# Patient Record
Sex: Female | Born: 1995 | Race: White | Hispanic: No | Marital: Single | Smoking: Never smoker
Health system: Southern US, Community
[De-identification: ages and names within clinical notes are randomized; demographics above are authoritative.]

## PROBLEM LIST (undated history)

## (undated) DIAGNOSIS — J45909 Unspecified asthma, uncomplicated: Secondary | ICD-10-CM

## (undated) DIAGNOSIS — F3181 Bipolar II disorder: Secondary | ICD-10-CM

## (undated) DIAGNOSIS — I10 Essential (primary) hypertension: Secondary | ICD-10-CM

## (undated) DIAGNOSIS — F419 Anxiety disorder, unspecified: Secondary | ICD-10-CM

## (undated) DIAGNOSIS — K219 Gastro-esophageal reflux disease without esophagitis: Secondary | ICD-10-CM

## (undated) DIAGNOSIS — F32A Depression, unspecified: Secondary | ICD-10-CM

## (undated) HISTORY — PX: NO PAST SURGERIES: SHX2092

## (undated) HISTORY — DX: Anxiety disorder, unspecified: F41.9

## (undated) HISTORY — DX: Depression, unspecified: F32.A

## (undated) HISTORY — DX: Bipolar II disorder: F31.81

## (undated) HISTORY — DX: Gastro-esophageal reflux disease without esophagitis: K21.9

## (undated) HISTORY — PX: TYMPANOSTOMY TUBE PLACEMENT: SHX32

---
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
## 2016-01-09 ENCOUNTER — Encounter (HOSPITAL_BASED_OUTPATIENT_CLINIC_OR_DEPARTMENT_OTHER): Payer: Self-pay | Admitting: Emergency Medicine

## 2016-01-09 ENCOUNTER — Emergency Department (HOSPITAL_BASED_OUTPATIENT_CLINIC_OR_DEPARTMENT_OTHER)
Admission: EM | Admit: 2016-01-09 | Discharge: 2016-01-09 | Disposition: A | Discharge status: Home / Self Care | Payer: 59 | Attending: Emergency Medicine | Admitting: Emergency Medicine

## 2016-01-09 DIAGNOSIS — Z791 Long term (current) use of non-steroidal anti-inflammatories (NSAID): Secondary | ICD-10-CM | POA: Insufficient documentation

## 2016-01-09 DIAGNOSIS — H9203 Otalgia, bilateral: Secondary | ICD-10-CM | POA: Diagnosis not present

## 2016-01-09 DIAGNOSIS — J45909 Unspecified asthma, uncomplicated: Secondary | ICD-10-CM | POA: Insufficient documentation

## 2016-01-09 DIAGNOSIS — Z7952 Long term (current) use of systemic steroids: Secondary | ICD-10-CM | POA: Insufficient documentation

## 2016-01-09 DIAGNOSIS — J029 Acute pharyngitis, unspecified: Secondary | ICD-10-CM | POA: Diagnosis present

## 2016-01-09 HISTORY — DX: Unspecified asthma, uncomplicated: J45.909

## 2016-01-09 MED ORDER — LIDOCAINE VISCOUS 2 % MT SOLN
15.0000 mL | Freq: Once | OROMUCOSAL | Status: AC
  Administered 2016-01-09: 15 mL via OROMUCOSAL
  Filled 2016-01-09: qty 15

## 2016-01-09 NOTE — ED Provider Notes (Signed)
CSN: [id number]     Arrival date & time 01/09/16  0820 History   First MD Initiated Contact with Patient 01/09/16 0845     Chief Complaint  Patient presents with  . Sore Throat     Patient is a 20 y.o. female presenting with pharyngitis. The history is provided by the patient. No language interpreter was used.  Sore Throat   Rose Gray is a 20 y.o. female who presents to the Emergency Department complaining of sore throat.  She reports 3 weeks of sore throat, congestion. She had fevers initially but these are now resolved. She saw her primary care provider 2 weeks ago and was given a course of steroids for a viral infection. 3 days ago she was seen again and had a strep test performed that was weakly positive and she was started on amoxicillin. Her steroids were also restarted that time. She reports sore throat that is bilateral sides for throat as well as ear pain. No cough. No vomiting. Symptoms are moderate, constant, worsening.  Past Medical History  Diagnosis Date  . Asthma    History reviewed. No pertinent past surgical history. No family history on file. Social History  Substance Use Topics  . Smoking status: Passive Smoke Exposure - Never Smoker  . Smokeless tobacco: None  . Alcohol Use: No   OB History    No data available     Review of Systems  All other systems reviewed and are negative.     Allergies  Review of patient's allergies indicates no known allergies.  Home Medications   Prior to Admission medications   Medication Sig Start Date End Date Taking? Authorizing Provider  ibuprofen (ADVIL,MOTRIN) 800 MG tablet Take 1 tablet (800 mg total) by mouth 3 (three) times daily. 12/22/13   Lonia Skinner Sofia, PA-C   BP 139/89 mmHg  Pulse 100  Temp(Src) 98.6 F (37 C) (Oral)  Resp 16  Ht  (1.702 m)  Wt 170 lb (77.111 kg)  BMI 26.62 kg/m2  SpO2 100% Physical Exam  Constitutional: She is oriented to person, place, and time. She appears well-developed  and well-nourished.  HENT:  Head: Normocephalic and atraumatic.  Right Ear: External ear normal.  Left Ear: External ear normal.  Large amount of tonsillar swelling and exudate bilaterally. No PTA.  Cardiovascular: Normal rate and regular rhythm.   No murmur heard. Pulmonary/Chest: Effort normal and breath sounds normal. No respiratory distress.  No stridor.  Abdominal: Soft. There is no tenderness. There is no rebound and no guarding.  Musculoskeletal: She exhibits no edema or tenderness.  Neurological: She is alert and oriented to person, place, and time.  Skin: Skin is warm and dry.  Psychiatric: She has a normal mood and affect. Her behavior is normal.  Nursing note and vitals reviewed.   ED Course  Procedures (including critical care time) Labs Review Labs Reviewed - No data to display  Imaging Review No results found. I have personally reviewed and evaluated these images and lab results as part of my medical decision-making.   EKG Interpretation None      MDM   Final diagnoses:  Viral pharyngitis    Patient here for evaluation of 3 weeks of sore throat, just started on amoxicillin. She is nontoxic appearing on examination. There is no evidence of peritonsillar abscess. Presentation is not consistent with epiglottitis, RPA. Discussed continuing her current treatment with antibiotics for pharyngitis, may be strep versus viral. Discussed close outpatient follow-up and return  precautions.    Tilden Fossa, MD 01/09/16 [phone]

## 2016-01-09 NOTE — Discharge Instructions (Signed)

## 2016-01-09 NOTE — ED Notes (Signed)
sorethroat  Head congestion  Intermittent fevers

## 2016-06-17 IMAGING — US US PELVIS COMPLETE
1 series · 14 of 25 positions shown · non-contrast
Comparison: None.

CLINICAL DATA: Abnormal uterine bleeding.

EXAM:
TRANSABDOMINAL ULTRASOUND OF PELVIS
TECHNIQUE: Transabdominal ultrasound examination of the pelvis was performed
including evaluation of the uterus, ovaries, adnexal regions, and
pelvic cul-de-sac.Patient not sexually active. Patient refused
transvaginal exam.

[Series 1: us pelvis complete · 0.20mm/px · 14 of 39 slices shown]
[im 1/39]
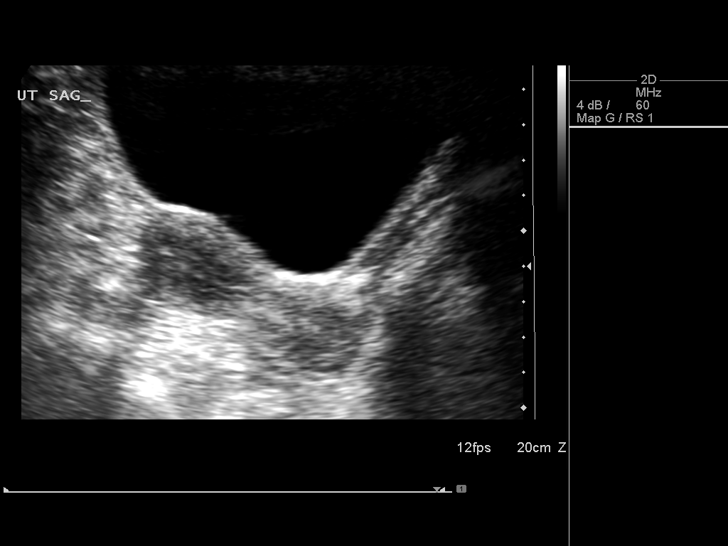
[im 4/39]
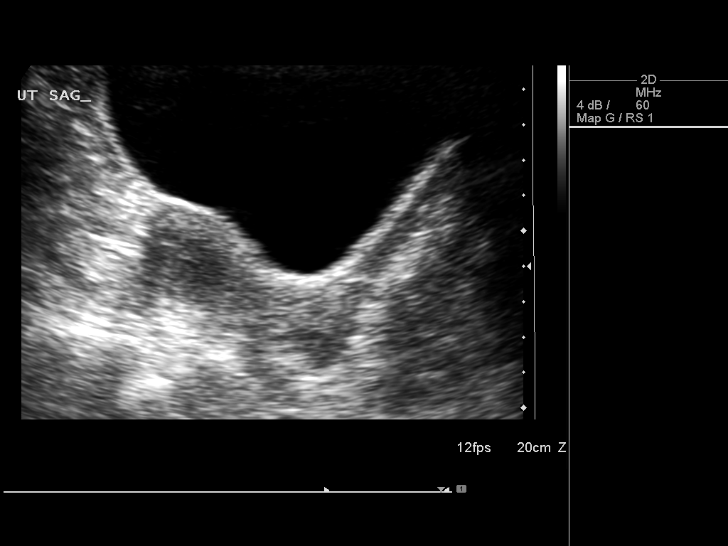
[im 7/39]
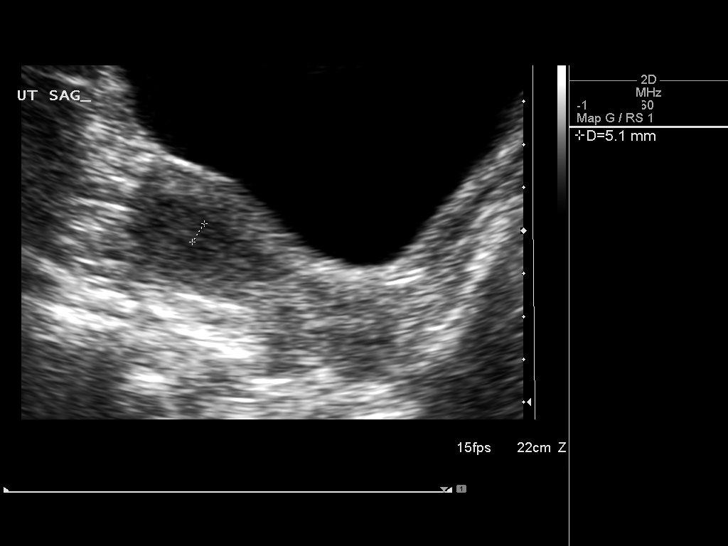
[im 10/39]
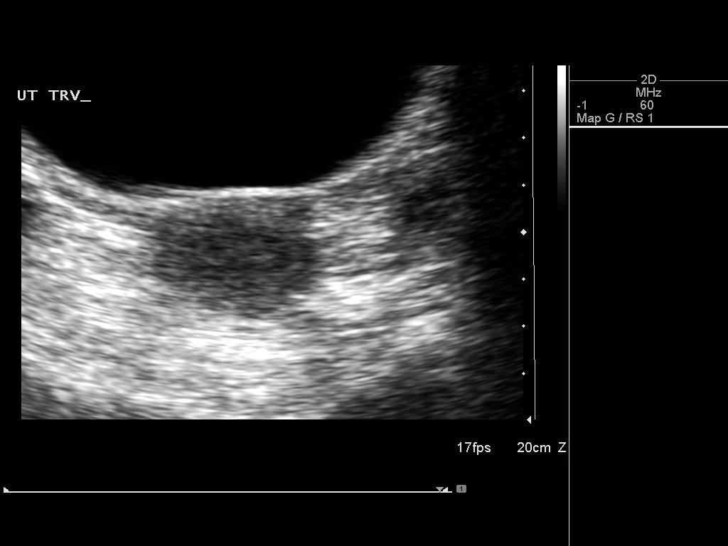
[im 13/39]
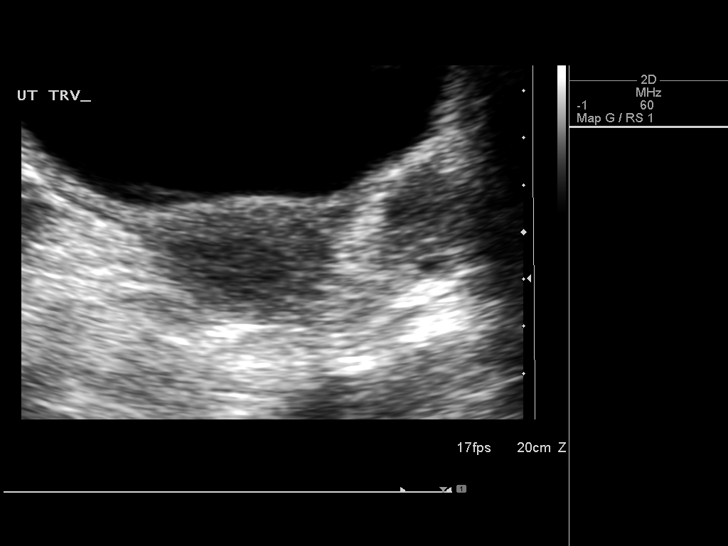
[im 15/39]
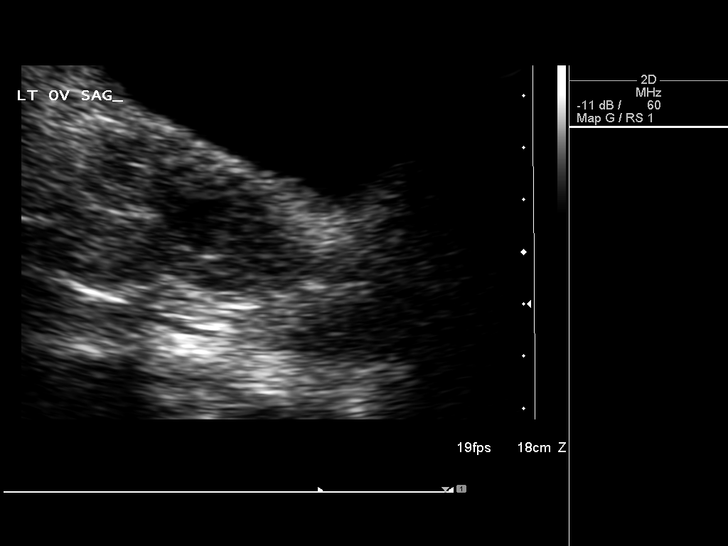
[im 18/39]
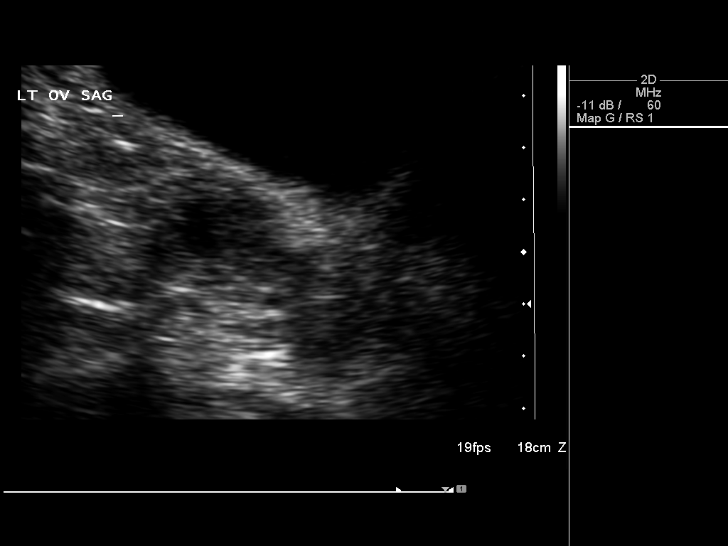
[im 21/39]
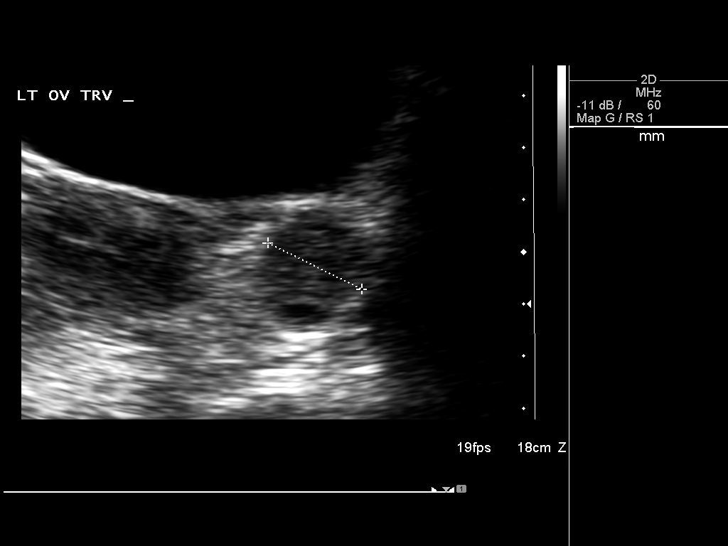
[im 24/39]
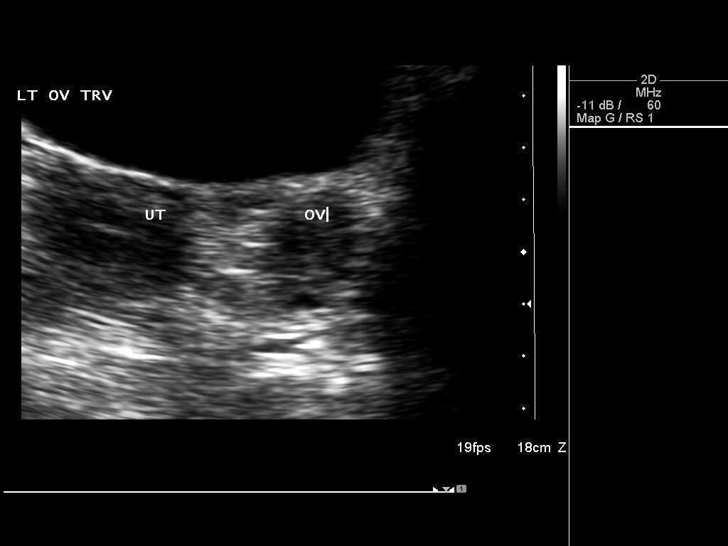
[im 26/39]
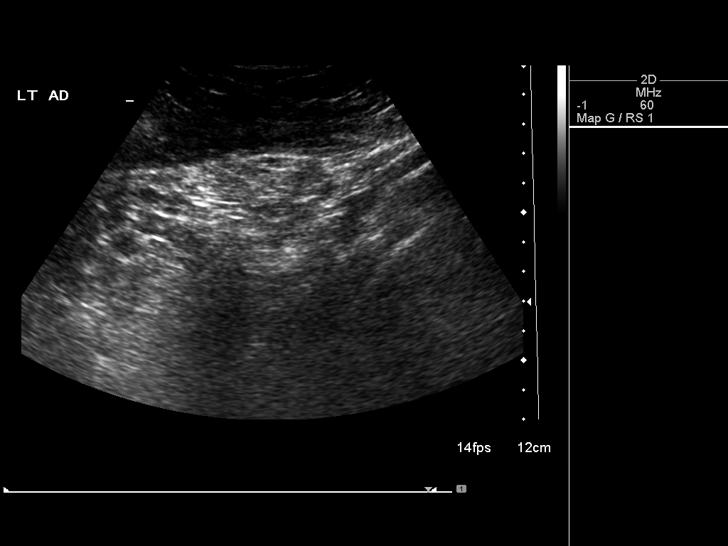
[im 29/39]
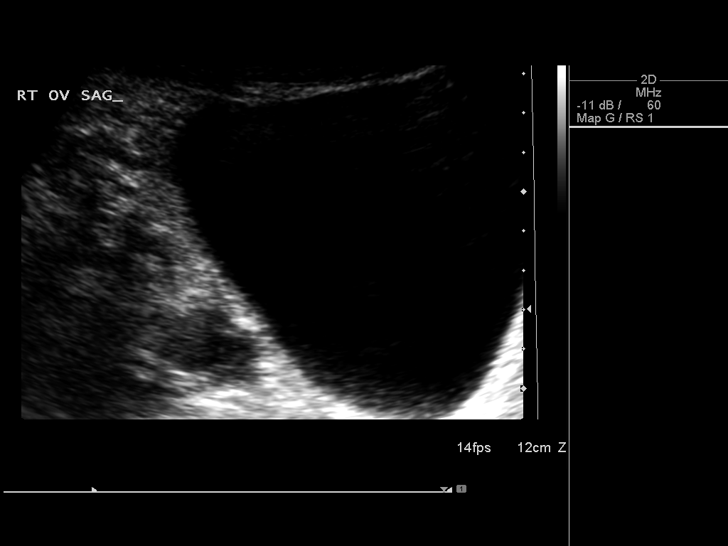
[im 32/39]
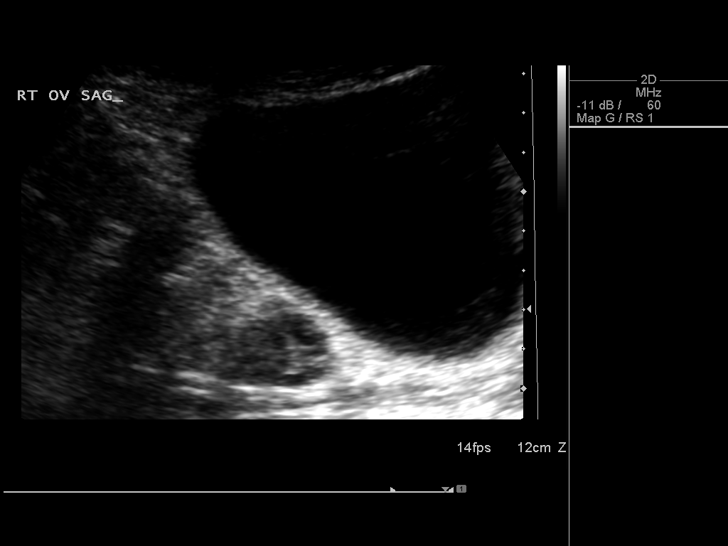
[im 35/39]
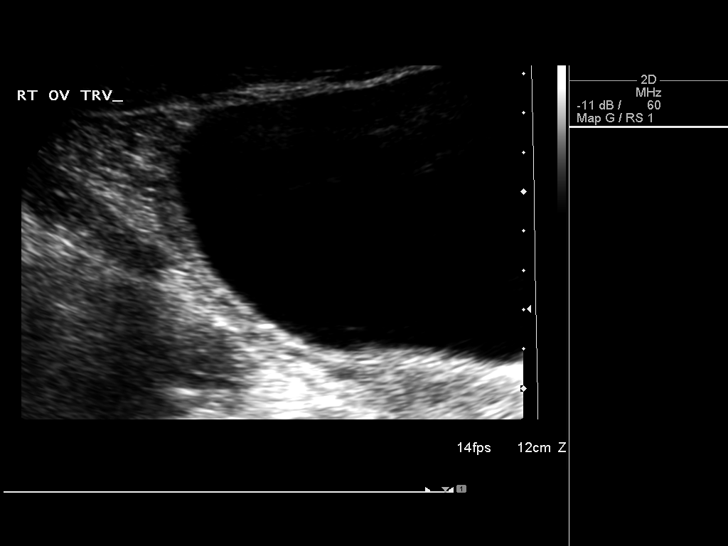
[im 39/39]
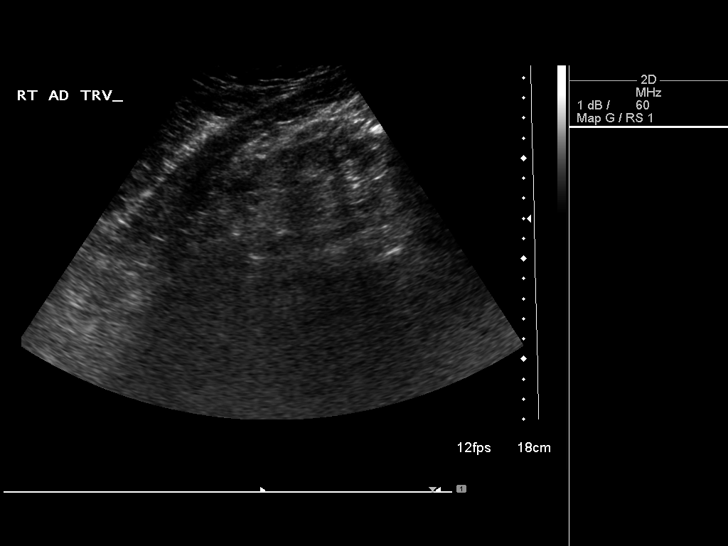

[14 of 25 positions shown; findings below may reference images not displayed]

FINDINGS: Uterus

Measurements: 7.1 x 2.5 x 3.6 cm. No fibroids or other mass
visualized.

Endometrium

Thickness: 5.1 mm.  No focal abnormality visualized.

Right ovary

Measurements: 3.1 x 1.9 x 2.0 cm. Normal appearance/no adnexal mass.

Left ovary

Measurements: 3.6 x 2.3 x 2.0 cm. Normal appearance/no adnexal mass.

Other findings:  No free fluid
IMPRESSION: Normal exam.

## 2018-01-21 ENCOUNTER — Ambulatory Visit
Admission: RE | Admit: 2018-01-21 | Discharge: 2018-01-21 | Disposition: A | Discharge status: Home / Self Care | Payer: Managed Care, Other (non HMO) | Source: Ambulatory Visit | Attending: Physician Assistant | Admitting: Physician Assistant

## 2018-01-21 ENCOUNTER — Other Ambulatory Visit: Payer: Self-pay | Admitting: Physician Assistant

## 2018-01-21 DIAGNOSIS — R05 Cough: Secondary | ICD-10-CM

## 2018-01-21 DIAGNOSIS — R059 Cough, unspecified: Secondary | ICD-10-CM

## 2018-04-18 ENCOUNTER — Other Ambulatory Visit: Payer: Self-pay

## 2018-04-18 ENCOUNTER — Encounter (HOSPITAL_COMMUNITY)
Admission: RE | Admit: 2018-04-18 | Discharge: 2018-04-18 | Disposition: A | Discharge status: Home / Self Care | Payer: Managed Care, Other (non HMO) | Source: Ambulatory Visit | Attending: Otolaryngology | Admitting: Otolaryngology

## 2018-04-18 ENCOUNTER — Encounter (HOSPITAL_COMMUNITY): Payer: Self-pay

## 2018-04-18 DIAGNOSIS — Z01812 Encounter for preprocedural laboratory examination: Secondary | ICD-10-CM | POA: Diagnosis present

## 2018-04-18 LAB — CBC
HCT: 41 % (ref 36.0–46.0)
Hemoglobin: 13.6 g/dL (ref 12.0–15.0)
MCH: 31.1 pg (ref 26.0–34.0)
MCHC: 33.2 g/dL (ref 30.0–36.0)
MCV: 93.8 fL (ref 78.0–100.0)
Platelets: 448 10*3/uL — ABNORMAL HIGH (ref 150–400)
RBC: 4.37 MIL/uL (ref 3.87–5.11)
RDW: 12.4 % (ref 11.5–15.5)
WBC: 9.3 10*3/uL (ref 4.0–10.5)

## 2018-04-18 NOTE — Pre-Procedure Instructions (Signed)
Rose Gray  04/18/2018      [id number] - [place], [place] - [address] AT [facility] [address] [place] [address] Phone: [phone] Fax: [phone]    Your procedure is scheduled on Apr 25, 2018.  Report to [facility] Admitting at 615 AM.  Call this number if you have problems the morning of surgery:[phone]   Remember:  No food after midnight.  You may drink clear liquids until 515 AM .  Clear liquids allowed are:     Water, Juice (non-citric and without pulp), Carbonated beverages, Clear Tea, Black Coffee only, Plain Jell-O only, Gatorade and Plain Popsicles only   Continue all medications as directed by your physician except follow these medication instructions before surgery below    Take these medicines the morning of surgery with A SIP OF WATER  Norgestimate-ethinyl estradiol triaphasic birth control  7 days prior to surgery STOP taking any Aspirin (unless otherwise instructed by your surgeon), Aleve, Naproxen, Ibuprofen, Motrin, Advil, Goody's, BC's, all herbal medications, fish oil, and all vitamins   Do not wear jewelry, make-up or nail polish.  Do not wear lotions, powders, or perfumes, or deodorant.  Do not shave 48 hours prior to surgery.    Do not bring valuables to the hospital.  [facility] is not responsible for any belongings or valuables.  Contacts, dentures or bridgework may not be worn into surgery.  Leave your suitcase in the car.  After surgery it may be brought to your room.  For patients admitted to the hospital, discharge time will be determined by your treatment team.  Patients discharged the day of surgery will not be allowed to drive home.    [facility]- Preparing For Surgery  Before surgery, you can play an important role. Because skin is not sterile, your skin needs to be as free of germs as possible. You can reduce the number of germs on your skin by washing with CHG  (chlorahexidine gluconate) Soap before surgery.  CHG is an antiseptic cleaner which kills germs and bonds with the skin to continue killing germs even after washing.  Oral Hygiene is also important to reduce your risk of infection.  Remember - BRUSH YOUR TEETH THE MORNING OF SURGERY  Please do not use if you have an allergy to CHG or antibacterial soaps. If your skin becomes reddened/irritated stop using the CHG.  Do not shave (including legs and underarms) for at least 48 hours prior to first CHG shower. It is OK to shave your face.  Please follow these instructions carefully.   1. Shower the NIGHT BEFORE SURGERY and the MORNING OF SURGERY with CHG.   2. If you chose to wash your hair, wash your hair first as usual with your normal shampoo.  3. After you shampoo, rinse your hair and body thoroughly to remove the shampoo.  4. Use CHG as you would any other liquid soap. You can apply CHG directly to the skin and wash gently with a scrungie or a clean washcloth.   5. Apply the CHG Soap to your body ONLY FROM THE NECK DOWN.  Do not use on open wounds or open sores. Avoid contact with your eyes, ears, mouth and genitals (private parts). Wash Face and genitals (private parts)  with your normal soap.  6. Wash thoroughly, paying special attention to the area where your surgery will be performed.  7. Thoroughly rinse  your body with warm water from the neck down.  8. DO NOT shower/wash with your normal soap after using and rinsing off the CHG Soap.  9. Pat yourself dry with a CLEAN TOWEL.  10. Wear CLEAN PAJAMAS to bed the night before surgery, wear comfortable clothes the morning of surgery  11. Place CLEAN SHEETS on your bed the night of your first shower and DO NOT SLEEP WITH PETS.  Day of Surgery:  Do not apply any deodorants/lotions.  Please wear clean clothes to the hospital/surgery center.   Remember to brush your teeth.    Please read over the following fact sheets that you were  given. Pain Booklet and Coughing and Deep Breathing

## 2018-04-18 NOTE — Progress Notes (Addendum)
PCP: [facility]  Cardiologist: pt denies  EKG: pt denies  Stress test: pt denies  ECHO: pt denies  Cardiac Cath: pt denies  Chest x-ray: pt denies, no recent respiratory infections

## 2018-04-24 NOTE — Anesthesia Preprocedure Evaluation (Addendum)
Anesthesia Evaluation  Patient identified by MRN, date of birth, ID band Patient awake    Reviewed: Allergy & Precautions, NPO status , Patient's Chart, lab work & pertinent test results  Airway Mallampati: I  TM Distance: >3 FB Neck ROM: Full    Dental no notable dental hx. (+) Teeth Intact, Dental Advisory Given   Pulmonary neg pulmonary ROS,    Pulmonary exam normal breath sounds clear to auscultation       Cardiovascular negative cardio ROS Normal cardiovascular exam Rhythm:Regular Rate:Normal     Neuro/Psych negative neurological ROS  negative psych ROS   GI/Hepatic negative GI ROS, Neg liver ROS,   Endo/Other  negative endocrine ROS  Renal/GU negative Renal ROS  negative genitourinary   Musculoskeletal negative musculoskeletal ROS (+)   Abdominal   Peds negative pediatric ROS (+)  Hematology negative hematology ROS (+)   Anesthesia Other Findings   Reproductive/Obstetrics negative OB ROS                            Anesthesia Physical Anesthesia Plan  ASA: II  Anesthesia Plan: General   Post-op Pain Management:    Induction: Intravenous  PONV Risk Score and Plan: 3 and Treatment may vary due to age or medical condition, Scopolamine patch - Pre-op, Dexamethasone and Ondansetron  Airway Management Planned: Oral ETT  Additional Equipment:   Intra-op Plan:   Post-operative Plan: Extubation in OR  Informed Consent: I have reviewed the patients History and Physical, chart, labs and discussed the procedure including the risks, benefits and alternatives for the proposed anesthesia with the patient or authorized representative who has indicated his/her understanding and acceptance.   Dental advisory given  Plan Discussed with: CRNA  Anesthesia Plan Comments:       Anesthesia Quick Evaluation

## 2018-04-25 ENCOUNTER — Other Ambulatory Visit: Payer: Self-pay

## 2018-04-25 ENCOUNTER — Ambulatory Visit (HOSPITAL_COMMUNITY): Payer: Managed Care, Other (non HMO) | Admitting: Anesthesiology

## 2018-04-25 ENCOUNTER — Ambulatory Visit (HOSPITAL_COMMUNITY)
Admission: RE | Admit: 2018-04-25 | Discharge: 2018-04-25 | Disposition: A | Discharge status: Home / Self Care | Payer: Managed Care, Other (non HMO) | Source: Ambulatory Visit | Attending: Otolaryngology | Admitting: Otolaryngology

## 2018-04-25 ENCOUNTER — Encounter (HOSPITAL_COMMUNITY): Payer: Self-pay | Admitting: *Deleted

## 2018-04-25 ENCOUNTER — Encounter (HOSPITAL_COMMUNITY)
Admission: RE | Disposition: A | Discharge status: Home / Self Care | Payer: Self-pay | Source: Ambulatory Visit | Attending: Otolaryngology

## 2018-04-25 DIAGNOSIS — Z793 Long term (current) use of hormonal contraceptives: Secondary | ICD-10-CM | POA: Diagnosis not present

## 2018-04-25 DIAGNOSIS — J45909 Unspecified asthma, uncomplicated: Secondary | ICD-10-CM | POA: Insufficient documentation

## 2018-04-25 DIAGNOSIS — J3501 Chronic tonsillitis: Secondary | ICD-10-CM | POA: Diagnosis present

## 2018-04-25 HISTORY — PX: TONSILLECTOMY: SHX5217

## 2018-04-25 LAB — POCT PREGNANCY, URINE: Preg Test, Ur: NEGATIVE

## 2018-04-25 SURGERY — TONSILLECTOMY
Anesthesia: General | Site: Throat

## 2018-04-25 MED ORDER — PROMETHAZINE HCL 25 MG PO TABS: 25.0000 mg | ORAL_TABLET | Freq: Four times a day (QID) | ORAL | Status: DC | PRN

## 2018-04-25 MED ORDER — MIDAZOLAM HCL 2 MG/2ML IJ SOLN
INTRAMUSCULAR | Status: AC
  Filled 2018-04-25: qty 2

## 2018-04-25 MED ORDER — HYDROCODONE-ACETAMINOPHEN 7.5-325 MG/15ML PO SOLN
ORAL | Status: AC
  Filled 2018-04-25: qty 30

## 2018-04-25 MED ORDER — ACETAMINOPHEN 10 MG/ML IV SOLN
1000.0000 mg | Freq: Once | INTRAVENOUS | Status: DC | PRN
  Administered 2018-04-25: 1000 mg via INTRAVENOUS

## 2018-04-25 MED ORDER — KCL IN DEXTROSE-NACL 20-5-0.45 MEQ/L-%-% IV SOLN
INTRAVENOUS | Status: DC
  Administered 2018-04-25: 11:00:00 via INTRAVENOUS
  Filled 2018-04-25: qty 1000

## 2018-04-25 MED ORDER — PROPOFOL 10 MG/ML IV BOLUS
INTRAVENOUS | Status: AC
  Filled 2018-04-25: qty 40

## 2018-04-25 MED ORDER — DEXAMETHASONE SODIUM PHOSPHATE 10 MG/ML IJ SOLN
INTRAMUSCULAR | Status: DC | PRN
  Administered 2018-04-25: 10 mg via INTRAVENOUS

## 2018-04-25 MED ORDER — ACETAMINOPHEN 325 MG PO TABS
650.0000 mg | ORAL_TABLET | Freq: Once | ORAL | Status: AC
  Administered 2018-04-25: 650 mg via ORAL
  Filled 2018-04-25: qty 2

## 2018-04-25 MED ORDER — ROCURONIUM BROMIDE 100 MG/10ML IV SOLN
INTRAVENOUS | Status: DC | PRN
  Administered 2018-04-25: 50 mg via INTRAVENOUS

## 2018-04-25 MED ORDER — ONDANSETRON HCL 4 MG/2ML IJ SOLN
INTRAMUSCULAR | Status: DC | PRN
  Administered 2018-04-25: 4 mg via INTRAVENOUS

## 2018-04-25 MED ORDER — FENTANYL CITRATE (PF) 100 MCG/2ML IJ SOLN
INTRAMUSCULAR | Status: DC | PRN
  Administered 2018-04-25: 100 ug via INTRAVENOUS

## 2018-04-25 MED ORDER — FENTANYL CITRATE (PF) 250 MCG/5ML IJ SOLN
INTRAMUSCULAR | Status: AC
  Filled 2018-04-25: qty 5

## 2018-04-25 MED ORDER — HYDROCODONE-ACETAMINOPHEN 7.5-325 MG/15ML PO SOLN
10.0000 mL | ORAL | Status: DC | PRN
  Administered 2018-04-25 (×2): 15 mL via ORAL
  Filled 2018-04-25: qty 15

## 2018-04-25 MED ORDER — SUGAMMADEX SODIUM 200 MG/2ML IV SOLN
INTRAVENOUS | Status: DC | PRN
  Administered 2018-04-25: 200 mg via INTRAVENOUS

## 2018-04-25 MED ORDER — HYDROMORPHONE HCL 2 MG/ML IJ SOLN: 0.3000 mg | INTRAMUSCULAR | Status: DC | PRN

## 2018-04-25 MED ORDER — ACETAMINOPHEN 10 MG/ML IV SOLN
INTRAVENOUS | Status: AC
  Filled 2018-04-25: qty 100

## 2018-04-25 MED ORDER — SCOPOLAMINE 1 MG/3DAYS TD PT72
1.0000 | MEDICATED_PATCH | TRANSDERMAL | Status: DC
  Administered 2018-04-25: 1.5 mg via TRANSDERMAL
  Filled 2018-04-25: qty 1

## 2018-04-25 MED ORDER — IBUPROFEN 100 MG/5ML PO SUSP
400.0000 mg | Freq: Four times a day (QID) | ORAL | Status: DC | PRN
  Administered 2018-04-25: 400 mg via ORAL
  Filled 2018-04-25 (×2): qty 20

## 2018-04-25 MED ORDER — HYDROCODONE-ACETAMINOPHEN 7.5-325 MG PO TABS: 1.0000 | ORAL_TABLET | Freq: Once | ORAL | Status: DC | PRN

## 2018-04-25 MED ORDER — 0.9 % SODIUM CHLORIDE (POUR BTL) OPTIME
TOPICAL | Status: DC | PRN
  Administered 2018-04-25: 1000 mL

## 2018-04-25 MED ORDER — PROMETHAZINE HCL 25 MG RE SUPP
25.0000 mg | Freq: Four times a day (QID) | RECTAL | Status: DC | PRN
  Filled 2018-04-25: qty 1

## 2018-04-25 MED ORDER — PROMETHAZINE HCL 25 MG/ML IJ SOLN: 6.2500 mg | INTRAMUSCULAR | Status: DC | PRN

## 2018-04-25 MED ORDER — LIDOCAINE HCL (CARDIAC) PF 100 MG/5ML IV SOSY
PREFILLED_SYRINGE | INTRAVENOUS | Status: DC | PRN
  Administered 2018-04-25: 60 mg via INTRAVENOUS

## 2018-04-25 MED ORDER — LACTATED RINGERS IV SOLN
INTRAVENOUS | Status: DC
  Administered 2018-04-25: 07:00:00 via INTRAVENOUS

## 2018-04-25 MED ORDER — PROPOFOL 10 MG/ML IV BOLUS
INTRAVENOUS | Status: DC | PRN
  Administered 2018-04-25: 200 mg via INTRAVENOUS

## 2018-04-25 MED ORDER — NORGESTIM-ETH ESTRAD TRIPHASIC 0.18/0.215/0.25 MG-25 MCG PO TABS: 1.0000 | ORAL_TABLET | Freq: Every day | ORAL | Status: DC

## 2018-04-25 MED ORDER — HYDROCODONE-ACETAMINOPHEN 7.5-325 MG/15ML PO SOLN: 15.0000 mL | ORAL | 0 refills | Status: DC | PRN

## 2018-04-25 MED ORDER — MIDAZOLAM HCL 5 MG/5ML IJ SOLN
INTRAMUSCULAR | Status: DC | PRN
  Administered 2018-04-25: 2 mg via INTRAVENOUS

## 2018-04-25 MED ORDER — MEPERIDINE HCL 50 MG/ML IJ SOLN: 6.2500 mg | INTRAMUSCULAR | Status: DC | PRN

## 2018-04-25 SURGICAL SUPPLY — 32 items
BLADE SURG 15 STRL LF DISP TIS (BLADE) IMPLANT
BLADE SURG 15 STRL SS (BLADE)
CANISTER SUCT 3000ML PPV (MISCELLANEOUS) ×2 IMPLANT
CATH ROBINSON RED A/P 10FR (CATHETERS) IMPLANT
CLEANER TIP ELECTROSURG 2X2 (MISCELLANEOUS) ×2 IMPLANT
COAGULATOR SUCT SWTCH 10FR 6 (ELECTROSURGICAL) ×2 IMPLANT
CRADLE DONUT ADULT HEAD (MISCELLANEOUS) IMPLANT
DRAPE HALF SHEET 40X57 (DRAPES) ×1 IMPLANT
ELECT COATED BLADE 2.86 ST (ELECTRODE) ×2 IMPLANT
ELECT REM PT RETURN 9FT ADLT (ELECTROSURGICAL)
ELECT REM PT RETURN 9FT PED (ELECTROSURGICAL)
ELECTRODE REM PT RETRN 9FT PED (ELECTROSURGICAL) IMPLANT
ELECTRODE REM PT RTRN 9FT ADLT (ELECTROSURGICAL) IMPLANT
GAUZE SPONGE 4X4 16PLY XRAY LF (GAUZE/BANDAGES/DRESSINGS) ×2 IMPLANT
GLOVE BIO SURGEON STRL SZ7.5 (GLOVE) ×2 IMPLANT
GOWN STRL REUS W/ TWL LRG LVL3 (GOWN DISPOSABLE) ×2 IMPLANT
GOWN STRL REUS W/TWL LRG LVL3 (GOWN DISPOSABLE) ×4
KIT BASIN OR (CUSTOM PROCEDURE TRAY) ×2 IMPLANT
KIT TURNOVER KIT B (KITS) ×2 IMPLANT
NDL HYPO 25GX1X1/2 BEV (NEEDLE) IMPLANT
NEEDLE HYPO 25GX1X1/2 BEV (NEEDLE) IMPLANT
NS IRRIG 1000ML POUR BTL (IV SOLUTION) ×2 IMPLANT
PACK SURGICAL SETUP 50X90 (CUSTOM PROCEDURE TRAY) ×2 IMPLANT
PAD ARMBOARD 7.5X6 YLW CONV (MISCELLANEOUS) ×4 IMPLANT
PENCIL BUTTON HOLSTER BLD 10FT (ELECTRODE) ×2 IMPLANT
SPECIMEN JAR SMALL (MISCELLANEOUS) ×4 IMPLANT
SPONGE TONSIL 1.25 RF SGL STRG (GAUZE/BANDAGES/DRESSINGS) ×1 IMPLANT
SYR BULB 3OZ (MISCELLANEOUS) ×2 IMPLANT
TUBE CONNECTING 12X1/4 (SUCTIONS) ×2 IMPLANT
TUBE SALEM SUMP 14F W/ARV (TUBING) ×2 IMPLANT
TUBE SALEM SUMP 16 FR W/ARV (TUBING) IMPLANT
YANKAUER SUCT BULB TIP NO VENT (SUCTIONS) ×2 IMPLANT

## 2018-04-25 NOTE — Transfer of Care (Signed)
Immediate Anesthesia Transfer of Care Note  Patient: Rose Gray  Procedure(s) Performed: TONSILLECTOMY (N/A Throat)  Patient Location: PACU  Anesthesia Type:General  Level of Consciousness: awake, alert , oriented and sedated  Airway & Oxygen Therapy: Patient Spontanous Breathing  Post-op Assessment: Report given to RN, Post -op Vital signs reviewed and stable and Patient moving all extremities  Post vital signs: Reviewed and stable  Last Vitals:  Vitals Value Taken Time  BP 126/90 04/25/2018  8:34 AM  Temp    Pulse 92 04/25/2018  8:41 AM  Resp 16 04/25/2018  8:41 AM  SpO2 98 % 04/25/2018  8:41 AM  Vitals shown include unvalidated device data.  Last Pain:  Vitals:   04/25/18 0625  TempSrc: Oral         Complications: No apparent anesthesia complications

## 2018-04-25 NOTE — H&P (Signed)
Rose Gray is an 22 y.o. female.   Chief Complaint: Chronic tonsillitis HPI: 22 year old female with recurring tonsil infections who presents for surgical management.  Past Medical History:  Diagnosis Date  . Asthma    childhood    Past Surgical History:  Procedure Laterality Date  . NO PAST SURGERIES      History reviewed. No pertinent family history. Social History:  reports that she is a non-smoker but has been exposed to tobacco smoke. She has never used smokeless tobacco. She reports that she does not drink alcohol or use drugs.  Allergies: No Known Allergies  Medications Prior to Admission  Medication Sig Dispense Refill  . Norgestimate-Ethinyl Estradiol Triphasic 0.18/0.215/0.25 MG-25 MCG tab Take 1 tablet by mouth daily.      Results for orders placed or performed during the hospital encounter of 04/25/18 (from the past 48 hour(s))  Pregnancy, urine POC     Status: None   Collection Time: 04/25/18  6:30 AM  Result Value Ref Range   Preg Test, Ur NEGATIVE NEGATIVE    Comment:        THE SENSITIVITY OF THIS METHODOLOGY IS >24 mIU/mL    No results found.  Review of Systems  All other systems reviewed and are negative.   Blood pressure (!) 131/92, pulse (!) 108, temperature 98.1 F (36.7 C), temperature source Oral, resp. rate 20, weight 197 lb (89.4 kg), last menstrual period 04/17/2018, SpO2 100 %. Physical Exam  Constitutional: She is oriented to person, place, and time. She appears well-developed and well-nourished. No distress.  HENT:  Head: Normocephalic and atraumatic.  Right Ear: External ear normal.  Left Ear: External ear normal.  Nose: Nose normal.  Mouth/Throat: Oropharynx is clear and moist.  Tonsils 2+  Eyes: Pupils are equal, round, and reactive to light. Conjunctivae and EOM are normal.  Neck: Normal range of motion. Neck supple.  Cardiovascular: Normal rate.  Respiratory: Effort normal.  Musculoskeletal: Normal range of motion.   Neurological: She is alert and oriented to person, place, and time. No cranial nerve deficit.  Skin: Skin is warm and dry.  Psychiatric: She has a normal mood and affect. Her behavior is normal. Judgment and thought content normal.     Assessment/Plan Chronic tonsillitis To OR for tonsillectomy.  Christia Reading, MD 04/25/2018, 7:29 AM

## 2018-04-25 NOTE — Progress Notes (Signed)
Pt admitted for observation for 4 hours, came up to unit on w/c. She has been stable on unit, tolerating fluids and PO medications.  She has been up going to the BRP in the PACU and up in the room with the assistance with staff and family members and friends.  Educations provided to patient about medication and advance directive.    Pt to be discharge home with family/friends.

## 2018-04-25 NOTE — Anesthesia Procedure Notes (Signed)
Procedure Name: Intubation Date/Time: 04/25/2018 7:54 AM Performed by: Fransisca Kaufmann, CRNA Pre-anesthesia Checklist: Patient identified, Emergency Drugs available, Suction available and Patient being monitored Patient Re-evaluated:Patient Re-evaluated prior to induction Oxygen Delivery Method: Circle System Utilized Preoxygenation: Pre-oxygenation with 100% oxygen Induction Type: IV induction Ventilation: Mask ventilation without difficulty Laryngoscope Size: Miller and 2 Grade View: Grade I Tube type: Oral Tube size: 7.0 mm Number of attempts: 1 Airway Equipment and Method: Stylet and Oral airway Placement Confirmation: ETT inserted through vocal cords under direct vision,  positive ETCO2 and breath sounds checked- equal and bilateral Secured at: 22 cm Tube secured with: Tape Dental Injury: Teeth and Oropharynx as per pre-operative assessment

## 2018-04-25 NOTE — Op Note (Signed)
NAME: Rose Gray, SEESE MEDICAL RECORD ZO:[id number] ACCOUNT [id number] DATE OF BIRTH:11/23/1996 FACILITY: [facility] LOCATION: [facility] PHYSICIAN:Lory Galan Pearletha Alfred, MD  OPERATIVE REPORT  DATE OF PROCEDURE:  04/25/2018  PREOPERATIVE DIAGNOSIS:  Chronic tonsillitis.  POSTOPERATIVE DIAGNOSIS:  Chronic tonsillitis.  PROCEDURE:  Tonsillectomy.  SURGEON:  Christia Reading, MD  ANESTHESIA:  General endotracheal anesthesia.  COMPLICATIONS:  None.  INDICATIONS:  The patient is a 22 year old female who has had repeated tonsil infections over a significant amount of time and presents to the operating room for surgical management.  FINDINGS:  Tonsils were 1+.  DESCRIPTION OF PROCEDURE:  The patient was identified in the holding room and informed consent having been obtained including discussion of risks, benefits, alternatives.  The patient was brought to the operative suite and placed on the operating table  in supine position.  Anesthesia was induced and the patient was intubated by the anesthesia team without difficulty.  The patient was given intravenous steroids during the case.  The eyes were taped closed and the bed was turned 90 degrees from  anesthesia.  A head wrap was placed around the patient's head and a Crowe-Davis retractor was inserted in the mouth and opened to reveal the oropharynx.  This was placed in suspension on the Mayo stand.  The right tonsil was grasped with a straight Allis  and retracted medially while a curvilinear incision was made along the anterior tonsillar pillar using Bovie electrocautery on a setting of 20.  Dissection continued in the subcapsular plane until the tonsil was removed.  The same procedure was then  carried out on the left side.  Tonsils were sent separately for pathology.  Bleeding was then controlled on both sides using suction cautery on a setting of 30.  After this was completed, the nose and throat were copiously irrigated with saline and a  flexible  catheter was passed down the esophagus to suction out the stomach and esophagus.  The retractor was taken out of suspension and removed from the patient's mouth.    She was turned back to anesthesia for wakeup ,was extubated in the OR and moved to the recovery room in stable condition.  AN/NUANCE  D:04/25/2018 T:04/25/2018 JOB:[id number]

## 2018-04-25 NOTE — Anesthesia Postprocedure Evaluation (Signed)
Anesthesia Post Note  Patient: Rose Gray  Procedure(s) Performed: TONSILLECTOMY (N/A Throat)     Patient location during evaluation: PACU Anesthesia Type: General Level of consciousness: awake and alert Pain management: pain level controlled Vital Signs Assessment: post-procedure vital signs reviewed and stable Respiratory status: spontaneous breathing, nonlabored ventilation, respiratory function stable and patient connected to nasal cannula oxygen Cardiovascular status: blood pressure returned to baseline and stable Postop Assessment: no apparent nausea or vomiting Anesthetic complications: no    Last Vitals:  Vitals:   04/25/18 0915 04/25/18 0933  BP:  118/71  Pulse: 94 67  Resp: 18 17  Temp:  37.3 C  SpO2: 100% 99%    Last Pain:  Vitals:   04/25/18 0935  TempSrc:   PainSc: 2                  Trevor Iha

## 2018-04-25 NOTE — Progress Notes (Signed)
MD called in and check on patient, and she was doing well and tolerating her PO and mediations.  Pt discharged Discharge instructions reviewed with the Patient and family.  Family left to pick up her prescriptions. Pt left with friend.

## 2018-04-25 NOTE — Brief Op Note (Signed)
04/25/2018  8:19 AM  PATIENT:  Rose Gray  21 y.o. female  PRE-OPERATIVE DIAGNOSIS:  chronic tonsillitis  POST-OPERATIVE DIAGNOSIS:  chronic tonsillitis  PROCEDURE:  Procedure(s): TONSILLECTOMY (N/A)  SURGEON:  Surgeon(s) and Role:    * Christia Reading, MD - Primary  PHYSICIAN ASSISTANT:   ASSISTANTS: none   ANESTHESIA:   general  EBL: Minimal  BLOOD ADMINISTERED:none  DRAINS: none   LOCAL MEDICATIONS USED:  NONE  SPECIMEN:  Source of Specimen:  Right and left tonsils  DISPOSITION OF SPECIMEN:  PATHOLOGY  COUNTS:  YES  TOURNIQUET:  * No tourniquets in log *  DICTATION: .Other Dictation: Dictation Number [id number]  PLAN OF CARE: Admit for overnight observation  PATIENT DISPOSITION:  PACU - hemodynamically stable.   Delay start of Pharmacological VTE agent (>24hrs) due to surgical blood loss or risk of bleeding: yes

## 2018-04-26 ENCOUNTER — Encounter (HOSPITAL_COMMUNITY): Payer: Self-pay | Admitting: Otolaryngology

## 2018-07-22 DIAGNOSIS — F4322 Adjustment disorder with anxiety: Secondary | ICD-10-CM | POA: Insufficient documentation

## 2018-11-28 ENCOUNTER — Emergency Department (HOSPITAL_BASED_OUTPATIENT_CLINIC_OR_DEPARTMENT_OTHER): Payer: Managed Care, Other (non HMO)

## 2018-11-28 ENCOUNTER — Other Ambulatory Visit: Payer: Self-pay

## 2018-11-28 ENCOUNTER — Emergency Department (HOSPITAL_BASED_OUTPATIENT_CLINIC_OR_DEPARTMENT_OTHER)
Admission: EM | Admit: 2018-11-28 | Discharge: 2018-11-28 | Disposition: A | Discharge status: Home / Self Care | Payer: Managed Care, Other (non HMO) | Attending: Emergency Medicine | Admitting: Emergency Medicine

## 2018-11-28 ENCOUNTER — Encounter (HOSPITAL_BASED_OUTPATIENT_CLINIC_OR_DEPARTMENT_OTHER): Payer: Self-pay | Admitting: *Deleted

## 2018-11-28 DIAGNOSIS — J111 Influenza due to unidentified influenza virus with other respiratory manifestations: Secondary | ICD-10-CM | POA: Diagnosis not present

## 2018-11-28 DIAGNOSIS — R69 Illness, unspecified: Secondary | ICD-10-CM

## 2018-11-28 DIAGNOSIS — J45909 Unspecified asthma, uncomplicated: Secondary | ICD-10-CM | POA: Diagnosis not present

## 2018-11-28 DIAGNOSIS — Z7722 Contact with and (suspected) exposure to environmental tobacco smoke (acute) (chronic): Secondary | ICD-10-CM | POA: Diagnosis not present

## 2018-11-28 DIAGNOSIS — R509 Fever, unspecified: Secondary | ICD-10-CM | POA: Diagnosis present

## 2018-11-28 NOTE — ED Triage Notes (Addendum)
Fever and cough x 2 days. Fever. Ibuprofen 5 hours ago. States she is taking Tamiflu.

## 2018-11-28 NOTE — Discharge Instructions (Signed)
Your chest x-ray did not show any sign of pneumonia.  Continue to take your medications as prescribed.  Drink plenty of fluids and plenty of rest.  Alternate 600 mg of ibuprofen and [phone] mg of Tylenol every 3 hours as needed for pain. Do not exceed 4000 mg of Tylenol daily.  Take ibuprofen with food to avoid upset stomach issues.  You can use over-the-counter cold and flu medicines such as Mucinex for relief of symptoms.  Follow-up with primary care provider if symptoms persist.  Return to the emergency department if any concerning signs or symptoms develop such as worsening chest pain, worsening shortness of breath, fevers not controlled by ibuprofen or Tylenol, or passing out.

## 2018-11-28 NOTE — ED Notes (Signed)
Pt c/o cough and fever since Christmas with the highest temp at 100.6 on Christmas night. Was seen at the doctor yesterday and put on tamiflu, states she feels worse and was advised to go to the ED should that happen. Pt took tylenol, unknown if it was 650mg  or 1000mg  at 1300.

## 2018-11-28 NOTE — ED Provider Notes (Signed)
[facility] EMERGENCY DEPARTMENT Provider Note   CSN: [id number] Arrival date & time: 11/28/18  1829     History   Chief Complaint Chief Complaint  Patient presents with  . Fever  . Cough    HPI Rose Gray is a 22 y.o. female with history of childhood asthma presents for evaluation of gradual onset, progressively worsening flulike symptoms for 3 days.  She notes mild nasal congestion, fevers with maximum temperature 101.6 F, dry cough, and chest tightness.  Has had improvement with ibuprofen, DayQuil, and NyQuil.  Went to her PCP yesterday, tested negative for the flu but was offered Tamiflu anyway.  Has had 3 doses.  Was also given Hydromet cough syrup.  Notes that she feels worse today and that her chest feels more tight.  She is a non-smoker.  Denies abdominal pain, vomiting, sore throat.  Presenting here for chest xray "just in case".  The history is provided by the patient.    Past Medical History:  Diagnosis Date  . Asthma    childhood    Patient Active Problem List   Diagnosis Date Noted  . Chronic tonsillitis 04/25/2018    Past Surgical History:  Procedure Laterality Date  . NO PAST SURGERIES    . TONSILLECTOMY N/A 04/25/2018   Procedure: TONSILLECTOMY;  Surgeon: Christia ReadingBates, Dwight, MD;  [facility];  Service: ENT;  Laterality: N/A;     OB History   No obstetric history on file.      Home Medications    Prior to Admission medications   Medication Sig Start Date End Date Taking? Authorizing Provider  Norgestimate-Ethinyl Estradiol Triphasic 0.18/0.215/0.25 MG-25 MCG tab Take 1 tablet by mouth daily. 08/09/17  Yes [provider]  Oseltamivir Phosphate (TAMIFLU PO) Take by mouth.   Yes [provider]  HYDROcodone-acetaminophen (HYCET) 7.5-325 mg/15 ml solution Take 15 mLs by mouth every 4 (four) hours as needed for moderate pain. 04/25/18 04/25/19  Christia ReadingBates, Dwight, MD    Family History No family history on file.  Social  History Social History   Tobacco Use  . Smoking status: Passive Smoke Exposure - Never Smoker  . Smokeless tobacco: Never Used  Substance Use Topics  . Alcohol use: No  . Drug use: No     Allergies   Patient has no known allergies.   Review of Systems Review of Systems  Constitutional: Positive for chills, fatigue and fever.  HENT: Positive for congestion. Negative for sore throat.   Respiratory: Positive for cough, chest tightness and shortness of breath.   Gastrointestinal: Positive for nausea. Negative for abdominal pain and vomiting.  All other systems reviewed and are negative.    Physical Exam Updated Vital Signs BP 127/89   Pulse 79   Temp 99.3 F (37.4 C) (Oral)   Resp 18   Ht 5\' 7"  (1.702 m)   Wt 88.5 kg   LMP 11/25/2018   SpO2 100%   BMI 30.54 kg/m   Physical Exam Vitals signs and nursing note reviewed.  Constitutional:      General: She is not in acute distress.    Appearance: She is well-developed.  HENT:     Head: Normocephalic and atraumatic.     Jaw: No trismus or swelling.     Right Ear: Tympanic membrane and ear canal normal. Tympanic membrane is not injected, erythematous or bulging.     Left Ear: Tympanic membrane and ear canal normal. Tympanic membrane is not injected, erythematous or bulging.  Nose: Nose normal.     Right Sinus: No maxillary sinus tenderness or frontal sinus tenderness.     Left Sinus: No maxillary sinus tenderness or frontal sinus tenderness.     Mouth/Throat:     Mouth: Mucous membranes are moist.     Pharynx: Uvula midline. No oropharyngeal exudate, posterior oropharyngeal erythema or uvula swelling.     Tonsils: No tonsillar abscesses. Swelling: 1+ on the right. 1+ on the left.     Comments: Tolerating secretions without difficulty Eyes:     General:        Right eye: No discharge.        Left eye: No discharge.     Conjunctiva/sclera: Conjunctivae normal.  Neck:     Musculoskeletal: Normal range of motion.       Vascular: No JVD.     Trachea: No tracheal deviation.  Cardiovascular:     Rate and Rhythm: Normal rate.     Heart sounds: Normal heart sounds.  Pulmonary:     Effort: Pulmonary effort is normal.     Breath sounds: Normal breath sounds.  Chest:     Chest wall: No tenderness.  Abdominal:     General: Bowel sounds are normal. There is no distension.     Tenderness: There is no abdominal tenderness. There is no guarding.  Lymphadenopathy:     Cervical: No cervical adenopathy.  Skin:    General: Skin is warm and dry.     Capillary Refill: Capillary refill takes less than 2 seconds.     Findings: No erythema.  Neurological:     Mental Status: She is alert.  Psychiatric:        Behavior: Behavior normal.      ED Treatments / Results  Labs (all labs ordered are listed, but only abnormal results are displayed) Labs Reviewed - No data to display  EKG None  Radiology Dg Chest 2 View  Result Date: 11/28/2018 CLINICAL DATA:  Flu-like symptoms for 2 days EXAM: CHEST - 2 VIEW COMPARISON:  January 21, 2018 FINDINGS: The heart size and mediastinal contours are within normal limits. Both lungs are clear. The visualized skeletal structures are unremarkable. IMPRESSION: No active cardiopulmonary disease. Electronically Signed   By: Sherian ReinWei-Chen  Lin M.D.   On: 11/28/2018 19:49    Procedures Procedures (including critical care time)  Medications Ordered in ED Medications - No data to display   Initial Impression / Assessment and Plan / ED Course  I have reviewed the triage vital signs and the nursing notes.  Pertinent labs & imaging results that were available during my care of the patient were reviewed by me and considered in my medical decision making (see chart for details).     Patient with flulike symptoms presenting requesting chest x-ray "just in case ".  She has a low-grade fever in the ED, vital signs otherwise stable.  She is nontoxic in appearance.  No increased work  of breathing on examination.  Tolerating secretions without difficulty.  No meningeal signs.  Seen by her PCP yesterday, tested negative for influenza but was given a prescription for Tamiflu.  Has also been taking over-the-counter medications with improvement.  Lungs clear to auscultation bilaterally.  Chest x-ray shows no evidence of acute cardiopulmonary disease including bronchitis or pneumonia.  Suspect viral illness, though unlikely influenza given negative strep test.  Discussed the risks and benefits of Tamiflu.  Encouraged ongoing symptomatic treatment.  Recommend follow-up with PCP if symptoms persist.  Discussed strict  ED return precautions.  Patient and significant other verbalized understanding of and agreement with plan and patient stable for discharge home at this time.  Final Clinical Impressions(s) / ED Diagnoses   Final diagnoses:  Influenza-like illness    ED Discharge Orders    None       Bennye Alm 11/29/18 0026    Raeford Razor, MD 11/29/18 1520

## 2019-02-01 ENCOUNTER — Inpatient Hospital Stay (HOSPITAL_BASED_OUTPATIENT_CLINIC_OR_DEPARTMENT_OTHER)
Admission: EM | Admit: 2019-02-01 | Discharge: 2019-02-03 | DRG: 872 | Disposition: A | Discharge status: Home / Self Care | Payer: Managed Care, Other (non HMO) | Attending: Internal Medicine | Admitting: Internal Medicine

## 2019-02-01 ENCOUNTER — Other Ambulatory Visit: Payer: Self-pay

## 2019-02-01 ENCOUNTER — Encounter (HOSPITAL_BASED_OUTPATIENT_CLINIC_OR_DEPARTMENT_OTHER): Payer: Self-pay | Admitting: Emergency Medicine

## 2019-02-01 ENCOUNTER — Emergency Department (HOSPITAL_BASED_OUTPATIENT_CLINIC_OR_DEPARTMENT_OTHER): Payer: Managed Care, Other (non HMO)

## 2019-02-01 DIAGNOSIS — J101 Influenza due to other identified influenza virus with other respiratory manifestations: Secondary | ICD-10-CM | POA: Diagnosis present

## 2019-02-01 DIAGNOSIS — F419 Anxiety disorder, unspecified: Secondary | ICD-10-CM | POA: Diagnosis present

## 2019-02-01 DIAGNOSIS — R6510 Systemic inflammatory response syndrome (SIRS) of non-infectious origin without acute organ dysfunction: Secondary | ICD-10-CM | POA: Diagnosis present

## 2019-02-01 DIAGNOSIS — R079 Chest pain, unspecified: Secondary | ICD-10-CM | POA: Diagnosis present

## 2019-02-01 DIAGNOSIS — A4189 Other specified sepsis: Principal | ICD-10-CM | POA: Diagnosis present

## 2019-02-01 DIAGNOSIS — A419 Sepsis, unspecified organism: Secondary | ICD-10-CM | POA: Diagnosis present

## 2019-02-01 DIAGNOSIS — I10 Essential (primary) hypertension: Secondary | ICD-10-CM | POA: Diagnosis present

## 2019-02-01 HISTORY — DX: Essential (primary) hypertension: I10

## 2019-02-01 LAB — COMPREHENSIVE METABOLIC PANEL WITH GFR
ALT: 20 U/L (ref 0–44)
AST: 28 U/L (ref 15–41)
Albumin: 4.3 g/dL (ref 3.5–5.0)
Alkaline Phosphatase: 64 U/L (ref 38–126)
Anion gap: 11 (ref 5–15)
BUN: 8 mg/dL (ref 6–20)
CO2: 20 mmol/L — ABNORMAL LOW (ref 22–32)
Calcium: 9.4 mg/dL (ref 8.9–10.3)
Chloride: 103 mmol/L (ref 98–111)
Creatinine, Ser: 0.85 mg/dL (ref 0.44–1.00)
GFR calc Af Amer: >60 mL/min (ref >60)
GFR calc non Af Amer: >60 mL/min (ref >60)
Glucose, Bld: 117 mg/dL — ABNORMAL HIGH (ref 70–99)
Potassium: 3.5 mmol/L (ref 3.5–5.1)
Sodium: 134 mmol/L — ABNORMAL LOW (ref 135–145)
Total Bilirubin: 0.5 mg/dL (ref 0.3–1.2)
Total Protein: 7.9 g/dL (ref 6.5–8.1)

## 2019-02-01 LAB — INFLUENZA PANEL BY PCR (TYPE A & B)
Influenza A By PCR: NEGATIVE
Influenza B By PCR: NEGATIVE

## 2019-02-01 LAB — URINALYSIS, ROUTINE W REFLEX MICROSCOPIC
Bilirubin Urine: NEGATIVE
Glucose, UA: 100 mg/dL — AB
Hgb urine dipstick: NEGATIVE
Ketones, ur: NEGATIVE mg/dL
Leukocytes,Ua: NEGATIVE
Nitrite: NEGATIVE
Protein, ur: NEGATIVE mg/dL
Specific Gravity, Urine: <1.005 — ABNORMAL LOW (ref 1.005–1.030)
pH: 6.5 (ref 5.0–8.0)

## 2019-02-01 LAB — CBC WITH DIFFERENTIAL/PLATELET
Abs Immature Granulocytes: 0.04 K/uL (ref 0.00–0.07)
Basophils Absolute: 0.1 K/uL (ref 0.0–0.1)
Basophils Relative: 1 %
Eosinophils Absolute: 0 K/uL (ref 0.0–0.5)
Eosinophils Relative: 0 %
HCT: 41.2 % (ref 36.0–46.0)
Hemoglobin: 13.5 g/dL (ref 12.0–15.0)
Immature Granulocytes: 0 %
Lymphocytes Relative: 4 %
Lymphs Abs: 0.6 K/uL — ABNORMAL LOW (ref 0.7–4.0)
MCH: 31 pg (ref 26.0–34.0)
MCHC: 32.8 g/dL (ref 30.0–36.0)
MCV: 94.5 fL (ref 80.0–100.0)
Monocytes Absolute: 1 K/uL (ref 0.1–1.0)
Monocytes Relative: 8 %
Neutro Abs: 11 K/uL — ABNORMAL HIGH (ref 1.7–7.7)
Neutrophils Relative %: 87 %
Platelets: 422 K/uL — ABNORMAL HIGH (ref 150–400)
RBC: 4.36 MIL/uL (ref 3.87–5.11)
RDW: 12.7 % (ref 11.5–15.5)
WBC: 12.7 K/uL — ABNORMAL HIGH (ref 4.0–10.5)
nRBC: 0 % (ref 0.0–0.2)

## 2019-02-01 LAB — TROPONIN I: Troponin I: <0.03 ng/mL (ref <0.03)

## 2019-02-01 LAB — LACTIC ACID, PLASMA
Lactic Acid, Venous: 1.8 mmol/L (ref 0.5–1.9)
Lactic Acid, Venous: 3.4 mmol/L (ref 0.5–1.9)

## 2019-02-01 LAB — PREGNANCY, URINE: PREG TEST UR: NEGATIVE

## 2019-02-01 LAB — D-DIMER, QUANTITATIVE: D-Dimer, Quant: <0.27 ug/mL-FEU (ref 0.00–0.50)

## 2019-02-01 MED ORDER — ACETAMINOPHEN 500 MG PO TABS
1000.0000 mg | ORAL_TABLET | Freq: Once | ORAL | Status: AC
  Administered 2019-02-01: 1000 mg via ORAL
  Filled 2019-02-01: qty 2

## 2019-02-01 MED ORDER — VANCOMYCIN HCL IN DEXTROSE 1-5 GM/200ML-% IV SOLN
1000.0000 mg | Freq: Once | INTRAVENOUS | Status: AC
  Administered 2019-02-01: 1000 mg via INTRAVENOUS
  Filled 2019-02-01: qty 200

## 2019-02-01 MED ORDER — SODIUM CHLORIDE 0.9 % IV BOLUS
1000.0000 mL | Freq: Once | INTRAVENOUS | Status: AC
  Administered 2019-02-01: 1000 mL via INTRAVENOUS

## 2019-02-01 MED ORDER — CEFEPIME HCL 2 G IJ SOLR
INTRAMUSCULAR | Status: AC
  Filled 2019-02-01: qty 2

## 2019-02-01 MED ORDER — SODIUM CHLORIDE 0.9 % IV SOLN
2.0000 g | Freq: Once | INTRAVENOUS | Status: AC
  Administered 2019-02-01: 2 g via INTRAVENOUS
  Filled 2019-02-01: qty 2

## 2019-02-01 MED ORDER — METRONIDAZOLE IN NACL 5-0.79 MG/ML-% IV SOLN
500.0000 mg | Freq: Three times a day (TID) | INTRAVENOUS | Status: DC
  Administered 2019-02-01: 500 mg via INTRAVENOUS
  Filled 2019-02-01: qty 100

## 2019-02-01 MED ORDER — DIPHENHYDRAMINE HCL 50 MG/ML IJ SOLN
25.0000 mg | Freq: Once | INTRAMUSCULAR | Status: AC
  Administered 2019-02-01: 25 mg via INTRAVENOUS
  Filled 2019-02-01: qty 1

## 2019-02-01 NOTE — ED Notes (Signed)
Pt c/o sudden onset scalp itching and burning. Vancomycin rate reduced to 100 mL/h and pt states itching/burning is still present but improved. EDP notified. Orders given to continue at rate of 100 mL/h for remainder of dose and Benadryl 25 mg IV.

## 2019-02-01 NOTE — ED Notes (Signed)
Staff @bedside , and wtg on u-preg results

## 2019-02-01 NOTE — ED Notes (Signed)
[facility] notified (Jaime) - patient ready for transport 

## 2019-02-01 NOTE — ED Triage Notes (Signed)
Pt states that approx 0700 this morning she was awakened by chest pain and shortness of breath. Hx of asthma but does not require treatment. Pt states that she was at a bonfire last night. Micah Flesher out with family and stated that when she walked short distances she got short of breath and coughed. Not coughing anything up. States she is on birth control. States she is blowing green mucous and states losing her voice. Subjective fever and chills. No OTC medications.

## 2019-02-01 NOTE — ED Provider Notes (Signed)
Emergency Department Provider Note   I have reviewed the triage vital signs and the nursing notes.   HISTORY  Chief Complaint Chest Pain and Shortness of Breath   HPI Rose Gray is a 23 y.o. female with PMH of Asthma but not requiring treatment at home since childhood presents to the emergency department with acute onset shortness of breath, cough, chest discomfort.  Symptoms began this morning.  The patient was at a bonfire last night but did not feel chest pain or shortness of breath while at that event.  She reports fevers with cough productive of some green sputum since today.  She is experiencing body aches.  No nausea or vomiting.  Notes some diarrhea symptoms without blood.  No abdominal or back discomfort. No radiation of symptoms or modifying factors.   Past Medical History:  Diagnosis Date  . Asthma    childhood  . Hypertension     Patient Active Problem List   Diagnosis Date Noted  . Chest pain 02/02/2019  . Essential hypertension 02/02/2019  . SIRS (systemic inflammatory response syndrome) (HCC) 02/01/2019  . Chronic tonsillitis 04/25/2018    Past Surgical History:  Procedure Laterality Date  . NO PAST SURGERIES    . TONSILLECTOMY N/A 04/25/2018   Procedure: TONSILLECTOMY;  Surgeon: Christia Reading, MD;  Location: [facility] OR;  Service: ENT;  Laterality: N/A;   Allergies Vancomycin  Family History  Problem Relation Age of Onset  . Diabetes Mellitus II Maternal Grandmother     Social History Social History   Tobacco Use  . Smoking status: Passive Smoke Exposure - Never Smoker  . Smokeless tobacco: Never Used  Substance Use Topics  . Alcohol use: No  . Drug use: No    Review of Systems  Constitutional: Positive fever/chills and body aches.  Eyes: No visual changes. ENT: No sore throat. Cardiovascular: Positive chest pain. Respiratory: Positive shortness of breath and cough.  Gastrointestinal: No abdominal pain.  No nausea, no vomiting.  No  diarrhea.  No constipation. Genitourinary: Negative for dysuria. Musculoskeletal: Negative for back pain. Skin: Negative for rash. Neurological: Negative for focal weakness or numbness. Positive HA.   10-point ROS otherwise negative.  ____________________________________________   PHYSICAL EXAM:  VITAL SIGNS: ED Triage Vitals  Enc Vitals Group     BP 02/01/19 1910 (!) 132/96     Pulse Rate 02/01/19 1910 (!) 150     Resp 02/01/19 1910 18     Temp 02/01/19 1910 99.7 F (37.6 C)     Temp Source 02/01/19 1910 Oral     SpO2 02/01/19 1910 100 %     Weight 02/01/19 1911 190 lb (86.2 kg)     Height 02/01/19 1911  (1.702 m)   Constitutional: Alert and oriented. Well appearing and in no acute distress. Eyes: Conjunctivae are normal.  Head: Atraumatic. Nose: No congestion/rhinnorhea. Mouth/Throat: Mucous membranes are dry.  Neck: No stridor.   Cardiovascular: Sinus tachycardia. Good peripheral circulation. Grossly normal heart sounds.   Respiratory: Normal respiratory effort.  No retractions. Lungs CTAB. Gastrointestinal: Soft and nontender. No distention.  Musculoskeletal: No lower extremity tenderness nor edema. No gross deformities of extremities. Neurologic:  Normal speech and language. No gross focal neurologic deficits are appreciated.  Skin:  Skin is warm, dry and intact. No rash noted.  ____________________________________________   LABS (all labs ordered are listed, but only abnormal results are displayed)  Labs Reviewed  COMPREHENSIVE METABOLIC PANEL - Abnormal; Notable for the following components:  Result Value   Sodium 134 (*)    CO2 20 (*)    Glucose, Bld 117 (*)    All other components within normal limits  LACTIC ACID, PLASMA - Abnormal; Notable for the following components:   Lactic Acid, Venous 3.4 (*)    All other components within normal limits  CBC WITH DIFFERENTIAL/PLATELET - Abnormal; Notable for the following components:   WBC 12.7 (*)     Platelets 422 (*)    Neutro Abs 11.0 (*)    Lymphs Abs 0.6 (*)    All other components within normal limits  URINALYSIS, ROUTINE W REFLEX MICROSCOPIC - Abnormal; Notable for the following components:   APPearance CLOUDY (*)    Specific Gravity, Urine <1.005 (*)    Glucose, UA 100 (*)    All other components within normal limits  CBC WITH DIFFERENTIAL/PLATELET - Abnormal; Notable for the following components:   RBC 3.75 (*)    Hemoglobin 11.8 (*)    Monocytes Absolute 1.2 (*)    All other components within normal limits  COMPREHENSIVE METABOLIC PANEL - Abnormal; Notable for the following components:   Potassium 3.3 (*)    CO2 21 (*)    Glucose, Bld 125 (*)    BUN 5 (*)    Calcium 8.3 (*)    Total Protein 6.3 (*)    Albumin 3.3 (*)    All other components within normal limits  LACTIC ACID, PLASMA - Abnormal; Notable for the following components:   Lactic Acid, Venous 3.3 (*)    All other components within normal limits  C-REACTIVE PROTEIN - Abnormal; Notable for the following components:   CRP 7.1 (*)    All other components within normal limits  URINE CULTURE  CULTURE, BLOOD (ROUTINE X 2)  CULTURE, BLOOD (ROUTINE X 2)  MRSA PCR SCREENING  RESPIRATORY PANEL BY PCR  PREGNANCY, URINE  TROPONIN I  LACTIC ACID, PLASMA  D-DIMER, QUANTITATIVE (NOT AT [facility])  INFLUENZA PANEL BY PCR (TYPE A & B)  PROCALCITONIN  PROTIME-INR  APTT  TSH  SEDIMENTATION RATE  CK  TROPONIN I  HIV ANTIBODY (ROUTINE TESTING W REFLEX)  TROPONIN I  TROPONIN I   ____________________________________________  EKG   EKG Interpretation  Date/Time:  Sunday February 01 2019 19:15:42 EST Ventricular Rate:  149 PR Interval:  118 QRS Duration: 70 QT Interval:  324 QTC Calculation: 510 R Axis:   80 Text Interpretation:  Sinus tachycardia Nonspecific ST abnormality Abnormal ECG No STEMI.  Confirmed by Alona Bene [phone]) on 02/01/2019 7:27:17 PM        ____________________________________________  RADIOLOGY  Dg Chest 2 View  Result Date: 02/01/2019 CLINICAL DATA:  Cough and shortness of breath. Awoke with chest pain. EXAM: CHEST - 2 VIEW COMPARISON:  11/28/2018 FINDINGS: The cardiomediastinal contours are normal. The lungs are clear. Pulmonary vasculature is normal. No consolidation, pleural effusion, or pneumothorax. No acute osseous abnormalities are seen. IMPRESSION: Unremarkable radiographs of the chest. Electronically Signed   By: Narda Rutherford M.D.   On: 02/01/2019 20:39    ____________________________________________   PROCEDURES  Procedure(s) performed:   .Critical Care Performed by: Maia Plan, MD Authorized by: Maia Plan, MD   Critical care provider statement:    Critical care time (minutes):  35   Critical care time was exclusive of:  Separately billable procedures and treating other patients and teaching time   Critical care was necessary to treat or prevent imminent or life-threatening deterioration of the following conditions:  Sepsis   Critical care was time spent personally by me on the following activities:  Blood draw for specimens, development of treatment plan with patient or surrogate, discussions with consultants, evaluation of patient's response to treatment, examination of patient, obtaining history from patient or surrogate, ordering and performing treatments and interventions, ordering and review of radiographic studies, ordering and review of laboratory studies, pulse oximetry, re-evaluation of patient's condition and review of old charts   I assumed direction of critical care for this patient from another provider in my specialty: no       ____________________________________________   INITIAL IMPRESSION / ASSESSMENT AND PLAN / ED COURSE  Pertinent labs & imaging results that were available during my care of the patient were reviewed by me and considered in my medical decision making  (see chart for details).  Patient presents to the ED with CP, SOB, and infectious type symptoms.  Symptoms were acute onset.  Patient does have fever here with significant sinus tachycardia.  No hypoxemia.  Patient has remote history of asthma but is not currently being treated at home.  No significant symptoms since she was a child.  Plan for chest x-ray, labs including lactate and blood cultures, but will also send a d-dimer.  Patient does not have wheezing on exam.  Flu test is negative.  Lactate downtrending but initially elevated to greater than 3.  No clear source for infection.  Suspect underlying viral syndrome.  Patient has not had travel history which would place her at risk for COVID-19.  No acute distress.  With persistent tachycardia the patient was given antibiotics and plan for observational admission.   Discussed patient's case with Hospitalist to request admission. Patient and family (if present) updated with plan. Care transferred to Hospitalist service.  I reviewed all nursing notes, vitals, pertinent old records, EKGs, labs, imaging (as available).  ____________________________________________  FINAL CLINICAL IMPRESSION(S) / ED DIAGNOSES  Final diagnoses:  Sepsis, due to unspecified organism, unspecified whether acute organ dysfunction present [facility])     MEDICATIONS GIVEN DURING THIS VISIT:  Medications  propranolol ER (INDERAL LA) 24 hr capsule 60 mg (60 mg Oral Given 02/02/19 0338)  FLUoxetine (PROZAC) capsule 20 mg (20 mg Oral Given 02/02/19 0922)  lamoTRIgine (LAMICTAL) tablet 25 mg (25 mg Oral Given 02/02/19 0922)  acetaminophen (TYLENOL) tablet 650 mg (has no administration in time range)    Or  acetaminophen (TYLENOL) suppository 650 mg (has no administration in time range)  ondansetron (ZOFRAN) tablet 4 mg (has no administration in time range)    Or  ondansetron (ZOFRAN) injection 4 mg (has no administration in time range)  0.9 %  sodium chloride infusion (  Intravenous Rate/Dose Verify 02/02/19 0840)  Chlorhexidine Gluconate Cloth 2 % PADS 6 each (has no administration in time range)  ipratropium-albuterol (DUONEB) 0.5-2.5 (3) MG/3ML nebulizer solution 3 mL (has no administration in time range)  cefTRIAXone (ROCEPHIN) 1 g in sodium chloride 0.9 % 100 mL IVPB (has no administration in time range)  azithromycin (ZITHROMAX) 500 mg in sodium chloride 0.9 % 250 mL IVPB (has no administration in time range)  sodium chloride 0.9 % bolus 1,000 mL (0 mLs Intravenous Stopped 02/01/19 2310)  acetaminophen (TYLENOL) tablet 1,000 mg (1,000 mg Oral Given 02/01/19 1950)  ceFEPIme (MAXIPIME) 2 g in sodium chloride 0.9 % 100 mL IVPB (0 g Intravenous Stopped 02/01/19 2120)  vancomycin (VANCOCIN) IVPB 1000 mg/200 mL premix (0 mg Intravenous Stopped 02/02/19 0007)  ceFEPIme (MAXIPIME) 2 g injection (  Duplicate 02/02/19 2047)  vancomycin (VANCOCIN) IVPB 1000 mg/200 mL premix (0 mg Intravenous Stopped 02/02/19 0007)  sodium chloride 0.9 % bolus 1,000 mL (0 mLs Intravenous Stopped 02/01/19 2300)  diphenhydrAMINE (BENADRYL) injection 25 mg (25 mg Intravenous Given 02/01/19 2152)  ceFEPIme (MAXIPIME) 2 g in sodium chloride 0.9 % 100 mL IVPB ( Intravenous Stopped 02/02/19 4315)    Note:  This document was prepared using Dragon voice recognition software and may include unintentional dictation errors.  Alona Bene, MD Emergency Medicine    Johnae Friley, Arlyss Repress, MD 02/02/19 [phone]

## 2019-02-01 NOTE — Progress Notes (Signed)
Pharmacy Communication Note:  Pharmacy consulted for Vancomycin and Cefepime dosing. One-time vancomycin and cefepime doses have been ordered. Pharmacy to sign off. Please reconsult if patient needs continued dosing.   Vinnie Level, PharmD., BCPS Clinical Pharmacist Clinical phone for 02/01/19 until 10:30pm: [phone]

## 2019-02-01 NOTE — ED Notes (Signed)
Pt was able to ambulate to restroom and back without assistance. NAD noted 

## 2019-02-01 NOTE — ED Notes (Signed)
Lab called and states lactic acid is 3.4. Dr. Jacqulyn Bath aware.

## 2019-02-02 ENCOUNTER — Encounter (HOSPITAL_COMMUNITY): Payer: Self-pay | Admitting: Internal Medicine

## 2019-02-02 ENCOUNTER — Inpatient Hospital Stay (HOSPITAL_COMMUNITY): Payer: Managed Care, Other (non HMO)

## 2019-02-02 DIAGNOSIS — R079 Chest pain, unspecified: Secondary | ICD-10-CM

## 2019-02-02 DIAGNOSIS — I10 Essential (primary) hypertension: Secondary | ICD-10-CM

## 2019-02-02 DIAGNOSIS — R6510 Systemic inflammatory response syndrome (SIRS) of non-infectious origin without acute organ dysfunction: Secondary | ICD-10-CM

## 2019-02-02 LAB — PROCALCITONIN: Procalcitonin: <0.1 ng/mL

## 2019-02-02 LAB — CBC WITH DIFFERENTIAL/PLATELET
Abs Immature Granulocytes: 0.05 10*3/uL (ref 0.00–0.07)
Basophils Absolute: 0.1 10*3/uL (ref 0.0–0.1)
Basophils Relative: 1 %
Eosinophils Absolute: 0 10*3/uL (ref 0.0–0.5)
Eosinophils Relative: 0 %
HCT: 36.8 % (ref 36.0–46.0)
Hemoglobin: 11.8 g/dL — ABNORMAL LOW (ref 12.0–15.0)
Immature Granulocytes: 1 %
Lymphocytes Relative: 9 %
Lymphs Abs: 0.9 10*3/uL (ref 0.7–4.0)
MCH: 31.5 pg (ref 26.0–34.0)
MCHC: 32.1 g/dL (ref 30.0–36.0)
MCV: 98.1 fL (ref 80.0–100.0)
MONO ABS: 1.2 10*3/uL — AB (ref 0.1–1.0)
Monocytes Relative: 12 %
Neutro Abs: 7.6 10*3/uL (ref 1.7–7.7)
Neutrophils Relative %: 77 %
Platelets: 358 10*3/uL (ref 150–400)
RBC: 3.75 MIL/uL — ABNORMAL LOW (ref 3.87–5.11)
RDW: 13.1 % (ref 11.5–15.5)
WBC: 9.8 10*3/uL (ref 4.0–10.5)
nRBC: 0 % (ref 0.0–0.2)

## 2019-02-02 LAB — TROPONIN I
Troponin I: <0.03 ng/mL (ref <0.03)
Troponin I: <0.03 ng/mL (ref <0.03)
Troponin I: <0.03 ng/mL (ref <0.03)

## 2019-02-02 LAB — CK: CK TOTAL: 49 U/L (ref 38–234)

## 2019-02-02 LAB — RESPIRATORY PANEL BY PCR
Adenovirus: NOT DETECTED
Bordetella pertussis: NOT DETECTED
CORONAVIRUS HKU1-RVPPCR: NOT DETECTED
Chlamydophila pneumoniae: NOT DETECTED
Coronavirus 229E: NOT DETECTED
Coronavirus NL63: NOT DETECTED
Coronavirus OC43: NOT DETECTED
INFLUENZA A-RVPPCR: UNDETERMINED — AB
Influenza B: NOT DETECTED
Metapneumovirus: NOT DETECTED
Mycoplasma pneumoniae: NOT DETECTED
Parainfluenza Virus 1: NOT DETECTED
Parainfluenza Virus 2: NOT DETECTED
Parainfluenza Virus 3: NOT DETECTED
Parainfluenza Virus 4: NOT DETECTED
RHINOVIRUS / ENTEROVIRUS - RVPPCR: DETECTED — AB
Respiratory Syncytial Virus: NOT DETECTED

## 2019-02-02 LAB — COMPREHENSIVE METABOLIC PANEL
ALT: 18 U/L (ref 0–44)
AST: 24 U/L (ref 15–41)
Albumin: 3.3 g/dL — ABNORMAL LOW (ref 3.5–5.0)
Alkaline Phosphatase: 50 U/L (ref 38–126)
Anion gap: 10 (ref 5–15)
BUN: 5 mg/dL — ABNORMAL LOW (ref 6–20)
CO2: 21 mmol/L — ABNORMAL LOW (ref 22–32)
Calcium: 8.3 mg/dL — ABNORMAL LOW (ref 8.9–10.3)
Chloride: 106 mmol/L (ref 98–111)
Creatinine, Ser: 0.81 mg/dL (ref 0.44–1.00)
GFR calc Af Amer: >60 mL/min (ref >60)
GFR calc non Af Amer: >60 mL/min (ref >60)
Glucose, Bld: 125 mg/dL — ABNORMAL HIGH (ref 70–99)
Potassium: 3.3 mmol/L — ABNORMAL LOW (ref 3.5–5.1)
Sodium: 137 mmol/L (ref 135–145)
Total Bilirubin: 0.6 mg/dL (ref 0.3–1.2)
Total Protein: 6.3 g/dL — ABNORMAL LOW (ref 6.5–8.1)

## 2019-02-02 LAB — PROTIME-INR
INR: 1 (ref 0.8–1.2)
Prothrombin Time: 12.7 seconds (ref 11.4–15.2)

## 2019-02-02 LAB — LACTIC ACID, PLASMA: LACTIC ACID, VENOUS: 3.3 mmol/L — AB (ref 0.5–1.9)

## 2019-02-02 LAB — C-REACTIVE PROTEIN: CRP: 7.1 mg/dL — ABNORMAL HIGH (ref <1.0)

## 2019-02-02 LAB — ECHOCARDIOGRAM COMPLETE
Height: 67 in
Weight: 3040 oz

## 2019-02-02 LAB — HIV ANTIBODY (ROUTINE TESTING W REFLEX): HIV Screen 4th Generation wRfx: NONREACTIVE

## 2019-02-02 LAB — MRSA PCR SCREENING: MRSA by PCR: NEGATIVE

## 2019-02-02 LAB — SEDIMENTATION RATE: Sed Rate: 6 mm/hr (ref 0–22)

## 2019-02-02 LAB — TSH: TSH: 0.701 u[IU]/mL (ref 0.350–4.500)

## 2019-02-02 LAB — APTT: aPTT: 31 seconds (ref 24–36)

## 2019-02-02 MED ORDER — OSELTAMIVIR PHOSPHATE 75 MG PO CAPS
75.0000 mg | ORAL_CAPSULE | Freq: Two times a day (BID) | ORAL | Status: DC
  Administered 2019-02-02 – 2019-02-03 (×3): 75 mg via ORAL
  Filled 2019-02-02 (×4): qty 1

## 2019-02-02 MED ORDER — VANCOMYCIN HCL 10 G IV SOLR
1250.0000 mg | Freq: Two times a day (BID) | INTRAVENOUS | Status: DC
  Filled 2019-02-02: qty 1250

## 2019-02-02 MED ORDER — ONDANSETRON HCL 4 MG/2ML IJ SOLN: 4.0000 mg | Freq: Four times a day (QID) | INTRAMUSCULAR | Status: DC | PRN

## 2019-02-02 MED ORDER — SODIUM CHLORIDE 0.9 % IV SOLN
2.0000 g | Freq: Three times a day (TID) | INTRAVENOUS | Status: DC
  Filled 2019-02-02: qty 2

## 2019-02-02 MED ORDER — LAMOTRIGINE 25 MG PO TABS
25.0000 mg | ORAL_TABLET | Freq: Every day | ORAL | Status: DC
  Administered 2019-02-02 – 2019-02-03 (×2): 25 mg via ORAL
  Filled 2019-02-02 (×2): qty 1

## 2019-02-02 MED ORDER — ACETAMINOPHEN 650 MG RE SUPP: 650.0000 mg | Freq: Four times a day (QID) | RECTAL | Status: DC | PRN

## 2019-02-02 MED ORDER — ONDANSETRON HCL 4 MG PO TABS: 4.0000 mg | ORAL_TABLET | Freq: Four times a day (QID) | ORAL | Status: DC | PRN

## 2019-02-02 MED ORDER — SODIUM CHLORIDE 0.9 % IV SOLN
1.0000 g | INTRAVENOUS | Status: DC
  Administered 2019-02-02: 1 g via INTRAVENOUS
  Filled 2019-02-02: qty 1
  Filled 2019-02-02: qty 10

## 2019-02-02 MED ORDER — METRONIDAZOLE IN NACL 5-0.79 MG/ML-% IV SOLN
500.0000 mg | Freq: Three times a day (TID) | INTRAVENOUS | Status: DC
  Administered 2019-02-02: 500 mg via INTRAVENOUS
  Filled 2019-02-02: qty 100

## 2019-02-02 MED ORDER — FLUTICASONE PROPIONATE 50 MCG/ACT NA SUSP
2.0000 | Freq: Every day | NASAL | Status: DC
  Administered 2019-02-02 – 2019-02-03 (×2): 2 via NASAL
  Filled 2019-02-02: qty 16

## 2019-02-02 MED ORDER — SODIUM CHLORIDE 0.9 % IV SOLN
2.0000 g | Freq: Once | INTRAVENOUS | Status: AC
  Administered 2019-02-02: 2 g via INTRAVENOUS
  Filled 2019-02-02: qty 2

## 2019-02-02 MED ORDER — FLUOXETINE HCL 20 MG PO CAPS
20.0000 mg | ORAL_CAPSULE | Freq: Every day | ORAL | Status: DC
  Administered 2019-02-02 – 2019-02-03 (×2): 20 mg via ORAL
  Filled 2019-02-02 (×2): qty 1

## 2019-02-02 MED ORDER — VANCOMYCIN HCL IN DEXTROSE 1-5 GM/200ML-% IV SOLN: 1000.0000 mg | Freq: Once | INTRAVENOUS | Status: DC

## 2019-02-02 MED ORDER — IPRATROPIUM-ALBUTEROL 0.5-2.5 (3) MG/3ML IN SOLN: 3.0000 mL | RESPIRATORY_TRACT | Status: DC | PRN

## 2019-02-02 MED ORDER — SODIUM CHLORIDE 0.9 % IV SOLN
500.0000 mg | INTRAVENOUS | Status: DC
  Administered 2019-02-02 – 2019-02-03 (×2): 500 mg via INTRAVENOUS
  Filled 2019-02-02 (×2): qty 500

## 2019-02-02 MED ORDER — SODIUM CHLORIDE 0.9 % IV SOLN
INTRAVENOUS | Status: AC
  Administered 2019-02-02 (×2): via INTRAVENOUS

## 2019-02-02 MED ORDER — ACETAMINOPHEN 325 MG PO TABS
650.0000 mg | ORAL_TABLET | Freq: Four times a day (QID) | ORAL | Status: DC | PRN
  Administered 2019-02-02: 650 mg via ORAL
  Filled 2019-02-02: qty 2

## 2019-02-02 MED ORDER — CHLORHEXIDINE GLUCONATE CLOTH 2 % EX PADS: 6.0000 | MEDICATED_PAD | Freq: Every day | CUTANEOUS | Status: DC

## 2019-02-02 MED ORDER — DIPHENHYDRAMINE HCL 50 MG/ML IJ SOLN
25.0000 mg | Freq: Two times a day (BID) | INTRAMUSCULAR | Status: DC
  Filled 2019-02-02: qty 1

## 2019-02-02 MED ORDER — PROPRANOLOL HCL ER 60 MG PO CP24
60.0000 mg | ORAL_CAPSULE | Freq: Every day | ORAL | Status: DC
  Administered 2019-02-02 – 2019-02-03 (×2): 60 mg via ORAL
  Filled 2019-02-02 (×2): qty 1

## 2019-02-02 NOTE — Progress Notes (Signed)
PROGRESS NOTE    Rose Gray  MWU:[id number] DOB: [birth year] DOA: 02/01/2019 PCP: Medicine, [facility]    Brief Narrative:  23 y.o. female with history of hypertension, anxiety presents to the ER at [facility] with complaints of chest pain and shortness of breath with productive cough since yesterday morning.  Night before this patient has been on around bonfire.  Denies any recent travel or sick contacts.  Patient states she gets short of breath on exertion with substernal chest pain which increases on coughing and deep breathing.  Denies nausea vomiting abdominal pain diarrhea.  Has not taken her Inderal which she takes for hypertension yesterday.  Patient states that 3 months ago in December 2019 patient was treated for viral infection.  About 3 weeks ago patient had taken some over-the-counter Biotene for hair loss and had rash briefly.  Rash has resolved.  Denies any insect bite joint swelling.  Denies any neck pain or headache or visual symptoms  ED Course: In the ER patient was tachycardic with heart rate around 150 temperature of 102.8 lactic acid of 3.4 WBC count of 12,000.  Chest x-ray was unremarkable UA was not showing any signs for infection.  EKG was showing sinus tachycardia.  D-dimer was negative.  Patient was not started on fluid bolus for sepsis and empiric antibiotics.  Admitted for further management.  Assessment & Plan:   Active Problems:   SIRS (systemic inflammatory response syndrome) (HCC)   Chest pain   Essential hypertension  1. SIRS source not clear 1. blood cultures urine cultures have been sent. 2. Currently on vanc, cefepime, flagyl 3. Will narrow coverage to azithro and rocephin   4. Continued on IVF hydration 5. Noted febrile overnight, currently afebrile. 2. HTN 1. BP currently stable 3. History of anxiety 1. Continue on Prozac and Lamictal 2. Stable at present  DVT prophylaxis: SCD's Code Status:  Full Family Communication: Pt in room, family not at bedside Disposition Plan: Uncertain at this time  Consultants:     Procedures:     Antimicrobials: Anti-infectives (From admission, onward)   Start     Dose/Rate Route Frequency Ordered Stop   02/02/19 1400  ceFEPIme (MAXIPIME) 2 g in sodium chloride 0.9 % 100 mL IVPB     2 g 200 mL/hr over 30 Minutes Intravenous Every 8 hours 02/02/19 0516     02/02/19 1000  vancomycin (VANCOCIN) 1,250 mg in sodium chloride 0.9 % 250 mL IVPB     1,250 mg 83.3 mL/hr over 180 Minutes Intravenous Every 12 hours 02/02/19 0536     02/02/19 0600  ceFEPIme (MAXIPIME) 2 g in sodium chloride 0.9 % 100 mL IVPB     2 g 200 mL/hr over 30 Minutes Intravenous  Once 02/02/19 0320 02/02/19 0614   02/02/19 0600  metroNIDAZOLE (FLAGYL) IVPB 500 mg     500 mg 100 mL/hr over 60 Minutes Intravenous Every 8 hours 02/02/19 0320     02/02/19 0600  ceFEPIme (MAXIPIME) 2 g in sodium chloride 0.9 % 100 mL IVPB  Status:  Discontinued     2 g 200 mL/hr over 30 Minutes Intravenous Every 8 hours 02/02/19 0515 02/02/19 0516   02/02/19 0330  vancomycin (VANCOCIN) IVPB 1000 mg/200 mL premix  Status:  Discontinued     1,000 mg 200 mL/hr over 60 Minutes Intravenous  Once 02/02/19 0320 02/02/19 0324   02/01/19 2130  vancomycin (VANCOCIN) IVPB 1000 mg/200 mL premix     1,000  mg 200 mL/hr over 60 Minutes Intravenous  Once 02/01/19 2030 02/02/19 0007   02/01/19 2030  ceFEPIme (MAXIPIME) 2 g in sodium chloride 0.9 % 100 mL IVPB     2 g 200 mL/hr over 30 Minutes Intravenous  Once 02/01/19 2023 02/01/19 2120   02/01/19 2030  metroNIDAZOLE (FLAGYL) IVPB 500 mg  Status:  Discontinued     500 mg 100 mL/hr over 60 Minutes Intravenous Every 8 hours 02/01/19 2023 02/02/19 0324   02/01/19 2030  vancomycin (VANCOCIN) IVPB 1000 mg/200 mL premix     1,000 mg 200 mL/hr over 60 Minutes Intravenous  Once 02/01/19 2023 02/02/19 0007   02/01/19 2027  ceFEPIme (MAXIPIME) 2 g injection    Note  to Pharmacy:  Charna Elizabeth   : cabinet override      02/01/19 2027 02/02/19 2047       Subjective: Reports feeling better  Objective: Vitals:   02/02/19 0500 02/02/19 0600 02/02/19 0700 02/02/19 0800  BP: 133/75 (!) 149/86 121/61 137/83  Pulse: (!) 120 (!) 114 (!) 107 (!) 105  Resp: 20 (!) Temp:    99.1 F (37.3 C)  TempSrc:    Oral  SpO2: 98% 99% 98% 99%  Weight:      Height:        Intake/Output Summary (Last 24 hours) at 02/02/2019 0842 Last data filed at 02/02/2019 0840 Gross per 24 hour  Intake 1729.89 ml  Output 1100 ml  Net 629.89 ml   Filed Weights   02/01/19 1911  Weight: 86.2 kg    Examination:  General exam: Appears calm and comfortable  Respiratory system: Clear to auscultation. Respiratory effort normal. Cardiovascular system: S1 & S2 heard, RRR Gastrointestinal system: Abdomen is nondistended, soft and nontender. No organomegaly or masses felt. Normal bowel sounds heard. Central nervous system: Alert and oriented. No focal neurological deficits. Extremities: Symmetric 5 x 5 power. Skin: No rashes, lesions Psychiatry: Judgement and insight appear normal. Mood & affect appropriate.   Data Reviewed: I have personally reviewed following labs and imaging studies  CBC: Recent Labs  Lab 02/01/19 1942 02/02/19 0801  WBC 12.7* 9.8  NEUTROABS 11.0* 7.6  HGB 13.5 11.8*  HCT 41.2 36.8  MCV 94.5 98.1  PLT 422* 358   Basic Metabolic Panel: Recent Labs  Lab 02/01/19 1942  NA 134*  K 3.5  CL 103  CO2 20*  GLUCOSE 117*  BUN 8  CREATININE 0.85  CALCIUM 9.4   GFR: Estimated Creatinine Clearance: 118 mL/min (by C-G formula based on SCr of 0.85 mg/dL). Liver Function Tests: Recent Labs  Lab 02/01/19 1942  AST 28  ALT 20  ALKPHOS 64  BILITOT 0.5  PROT 7.9  ALBUMIN 4.3   No results for input(s): LIPASE, AMYLASE in the last 168 hours. No results for input(s): AMMONIA in the last 168 hours. Coagulation Profile: Recent Labs  Lab  02/02/19 0801  INR 1.0   Cardiac Enzymes: Recent Labs  Lab 02/01/19 1942  TROPONINI <0.03   BNP (last 3 results) No results for input(s): PROBNP in the last 8760 hours. HbA1C: No results for input(s): HGBA1C in the last 72 hours. CBG: No results for input(s): GLUCAP in the last 168 hours. Lipid Profile: No results for input(s): CHOL, HDL, LDLCALC, TRIG, CHOLHDL, LDLDIRECT in the last 72 hours. Thyroid Function Tests: No results for input(s): TSH, T4TOTAL, FREET4, T3FREE, THYROIDAB in the last 72 hours. Anemia Panel: No results for input(s): VITAMINB12, FOLATE, FERRITIN, TIBC,  IRON, RETICCTPCT in the last 72 hours. Sepsis Labs: Recent Labs  Lab 02/01/19 1942 02/01/19 2332  LATICACIDVEN 3.4* 1.8    Recent Results (from the past 240 hour(s))  Culture, blood (routine x 2)     Status: None (Preliminary result)   Collection Time: 02/01/19  7:35 PM  Result Value Ref Range Status   Specimen Description BLOOD BLOOD LEFT FOREARM  Final   Special Requests   Final    BOTTLES DRAWN AEROBIC AND ANAEROBIC Blood Culture results may not be optimal due to an inadequate volume of blood received in culture bottles Performed at [facility], [address] [place], [place] [address]    Culture NO GROWTH < 12 HOURS  Final   Report Status PENDING  Incomplete  Urine culture     Status: None (Preliminary result)   Collection Time: 02/01/19  7:42 PM  Result Value Ref Range Status   Specimen Description   Final    URINE, CATHETERIZED Performed at [facility], [address] [place], [place] [address]    Special Requests NONE  Final   Culture PENDING  Incomplete   Report Status PENDING  Incomplete  Culture, blood (routine x 2)     Status: None (Preliminary result)   Collection Time: 02/01/19  7:50 PM  Result Value Ref Range Status   Specimen Description BLOOD BLOOD RIGHT HAND  Final   Special Requests   Final    BOTTLES DRAWN AEROBIC AND ANAEROBIC Blood Culture  adequate volume Performed at [facility], [address] [place], [place] [address]    Culture NO GROWTH < 12 HOURS  Final   Report Status PENDING  Incomplete  MRSA PCR Screening     Status: None   Collection Time: 02/02/19  1:09 AM  Result Value Ref Range Status   MRSA by PCR NEGATIVE NEGATIVE Final    Comment:        The GeneXpert MRSA Assay (FDA approved for NASAL specimens only), is one component of a comprehensive MRSA colonization surveillance program. It is not intended to diagnose MRSA infection nor to guide or monitor treatment for MRSA infections. Performed at [facility], [address], [place], [place] [address]      Radiology Studies: Dg Chest 2 View  Result Date: 02/01/2019 CLINICAL DATA:  Cough and shortness of breath. Awoke with chest pain. EXAM: CHEST - 2 VIEW COMPARISON:  11/28/2018 FINDINGS: The cardiomediastinal contours are normal. The lungs are clear. Pulmonary vasculature is normal. No consolidation, pleural effusion, or pneumothorax. No acute osseous abnormalities are seen. IMPRESSION: Unremarkable radiographs of the chest. Electronically Signed   By: Narda Rutherford M.D.   On: 02/01/2019 20:39    Scheduled Meds: . [COMPLETED] ceFEPIme      . Chlorhexidine Gluconate Cloth  6 each Topical Daily  . diphenhydrAMINE  25 mg Intravenous Q12H  . FLUoxetine  20 mg Oral Daily  . lamoTRIgine  25 mg Oral Daily  . propranolol ER  60 mg Oral Daily   Continuous Infusions: . sodium chloride 100 mL/hr at 02/02/19 0840  . ceFEPime (MAXIPIME) IV    . metronidazole Stopped (02/02/19 9485)  . vancomycin       LOS: 1 day   Rickey Barbara, MD [facility] Pager On Amion  If 7PM-7AM, please contact night-coverage 02/02/2019, 8:42 AM

## 2019-02-02 NOTE — Progress Notes (Signed)
CRITICAL VALUE ALERT  Critical Value:  Lactic Acid 3.3  Date & Time Notied: 02/02/19 0901   Provider Notified: Rhona Leavens, MD  Orders Received/Actions taken:  Awaiting new orders

## 2019-02-02 NOTE — H&P (Signed)
History and Physical    ELIZADETH Gray FMB:[id number] DOB: 1996/06/01 DOA: 02/01/2019  PCP: Medicine, [facility]  Patient coming from: Home.  Chief Complaint: Shortness of breath and chest pain.  HPI: Rose Gray is a 23 y.o. female with history of hypertension, anxiety presents to the ER at [facility] with complaints of chest pain and shortness of breath with productive cough since yesterday morning.  Night before this patient has been on around bonfire.  Denies any recent travel or sick contacts.  Patient states she gets short of breath on exertion with substernal chest pain which increases on coughing and deep breathing.  Denies nausea vomiting abdominal pain diarrhea.  Has not taken her Inderal which she takes for hypertension yesterday.  Patient states that 3 months ago in December 2019 patient was treated for viral infection.  About 3 weeks ago patient had taken some over-the-counter Biotene for hair loss and had rash briefly.  Rash has resolved.  Denies any insect bite joint swelling.  Denies any neck pain or headache or visual symptoms.  ED Course: In the ER patient was tachycardic with heart rate around 150 temperature of 102.8 lactic acid of 3.4 WBC count of 12,000.  Chest x-ray was unremarkable UA was not showing any signs for infection.  EKG was showing sinus tachycardia.  D-dimer was negative.  Patient was not started on fluid bolus for sepsis and empiric antibiotics.  Admitted for further management.  Review of Systems: As per HPI, rest all negative.   Past Medical History:  Diagnosis Date  . Asthma    childhood  . Hypertension     Past Surgical History:  Procedure Laterality Date  . NO PAST SURGERIES    . TONSILLECTOMY N/A 04/25/2018   Procedure: TONSILLECTOMY;  Surgeon: Christia Reading, MD;  Location: [facility] OR;  Service: ENT;  Laterality: N/A;     reports that she is a non-smoker but has been exposed to tobacco smoke. She has  never used smokeless tobacco. She reports that she does not drink alcohol or use drugs.  Allergies  Allergen Reactions  . Vancomycin Itching and Other (See Comments)    Became flushed in the face and itching and redness on scalp    Family History  Problem Relation Age of Onset  . Diabetes Mellitus II Maternal Grandmother     Prior to Admission medications   Medication Sig Start Date End Date Taking? Authorizing Provider  FLUoxetine (PROZAC) 20 MG capsule Take 20 mg by mouth daily.   Yes [provider]  lamoTRIgine (LAMICTAL) 25 MG tablet Take 25 mg by mouth daily.   Yes [provider]  Norgestimate-Ethinyl Estradiol Triphasic 0.18/0.215/0.25 MG-25 MCG tab Take 1 tablet by mouth daily. 08/09/17  Yes [provider]  propranolol ER (INDERAL LA) 60 MG 24 hr capsule Take 60 mg by mouth daily. 12/09/18  Yes [provider]  HYDROcodone-acetaminophen (HYCET) 7.5-325 mg/15 ml solution Take 15 mLs by mouth every 4 (four) hours as needed for moderate pain. Patient not taking: Reported on 02/02/2019 04/25/18 04/25/19  Christia Reading, MD    Physical Exam: Vitals:   02/02/19 0000 02/02/19 0130 02/02/19 0141 02/02/19 0230  BP: (!) 113/91  (!) 132/94   Pulse: (!) 107  (!) 113   Resp: 18  19   Temp:  100.3 F (37.9 C)  98.2 F (36.8 C)  TempSrc:  Axillary  Axillary  SpO2: 100%  100%   Weight:  Height:          Constitutional: Moderately built and nourished. Vitals:   02/02/19 0000 02/02/19 0130 02/02/19 0141 02/02/19 0230  BP: (!) 113/91  (!) 132/94   Pulse: (!) 107  (!) 113   Resp: 18  19   Temp:  100.3 F (37.9 C)  98.2 F (36.8 C)  TempSrc:  Axillary  Axillary  SpO2: 100%  100%   Weight:      Height:       Eyes: Anicteric no pallor. ENMT: No discharge from the ears eyes nose or mouth. Neck: No mass felt.  No neck rigidity. Respiratory: No rhonchi or crepitations. Cardiovascular: S1-S2 heard. Abdomen: Soft nontender bowel sounds  present. Musculoskeletal: No edema.  No joint effusion. Skin: No rash. Neurologic: Alert awake oriented to time place and person.  Moves all extremities. Psychiatric: Appears normal per normal affect.   Labs on Admission: I have personally reviewed following labs and imaging studies  CBC: Recent Labs  Lab 02/01/19 1942  WBC 12.7*  NEUTROABS 11.0*  HGB 13.5  HCT 41.2  MCV 94.5  PLT 422*   Basic Metabolic Panel: Recent Labs  Lab 02/01/19 1942  NA 134*  K 3.5  CL 103  CO2 20*  GLUCOSE 117*  BUN 8  CREATININE 0.85  CALCIUM 9.4   GFR: Estimated Creatinine Clearance: 118 mL/min (by C-G formula based on SCr of 0.85 mg/dL). Liver Function Tests: Recent Labs  Lab 02/01/19 1942  AST 28  ALT 20  ALKPHOS 64  BILITOT 0.5  PROT 7.9  ALBUMIN 4.3   No results for input(s): LIPASE, AMYLASE in the last 168 hours. No results for input(s): AMMONIA in the last 168 hours. Coagulation Profile: No results for input(s): INR, PROTIME in the last 168 hours. Cardiac Enzymes: Recent Labs  Lab 02/01/19 1942  TROPONINI <0.03   BNP (last 3 results) No results for input(s): PROBNP in the last 8760 hours. HbA1C: No results for input(s): HGBA1C in the last 72 hours. CBG: No results for input(s): GLUCAP in the last 168 hours. Lipid Profile: No results for input(s): CHOL, HDL, LDLCALC, TRIG, CHOLHDL, LDLDIRECT in the last 72 hours. Thyroid Function Tests: No results for input(s): TSH, T4TOTAL, FREET4, T3FREE, THYROIDAB in the last 72 hours. Anemia Panel: No results for input(s): VITAMINB12, FOLATE, FERRITIN, TIBC, IRON, RETICCTPCT in the last 72 hours. Urine analysis:    Component Value Date/Time   COLORURINE YELLOW 02/01/2019 1942   APPEARANCEUR CLOUDY (A) 02/01/2019 1942   LABSPEC <1.005 (L) 02/01/2019 1942   PHURINE 6.5 02/01/2019 1942   GLUCOSEU 100 (A) 02/01/2019 1942   HGBUR NEGATIVE 02/01/2019 1942   BILIRUBINUR NEGATIVE 02/01/2019 1942   KETONESUR NEGATIVE 02/01/2019  1942   PROTEINUR NEGATIVE 02/01/2019 1942   NITRITE NEGATIVE 02/01/2019 1942   LEUKOCYTESUR NEGATIVE 02/01/2019 1942   Sepsis Labs: @LABRCNTIP (procalcitonin:4,lacticidven:4) ) Recent Results (from the past 240 hour(s))  Urine culture     Status: None (Preliminary result)   Collection Time: 02/01/19  7:42 PM  Result Value Ref Range Status   Specimen Description   Final    URINE, CATHETERIZED Performed at [facility], [address] [place], [place] [id number]    Special Requests NONE  Final   Culture PENDING  Incomplete   Report Status PENDING  Incomplete  MRSA PCR Screening     Status: None   Collection Time: 02/02/19  1:09 AM  Result Value Ref Range Status   MRSA by PCR NEGATIVE  NEGATIVE Final    Comment:        The GeneXpert MRSA Assay (FDA approved for NASAL specimens only), is one component of a comprehensive MRSA colonization surveillance program. It is not intended to diagnose MRSA infection nor to guide or monitor treatment for MRSA infections. Performed at [facility], [address] [place], [place] [address]      Radiological Exams on Admission: Dg Chest 2 View  Result Date: 02/01/2019 CLINICAL DATA:  Cough and shortness of breath. Awoke with chest pain. EXAM: CHEST - 2 VIEW COMPARISON:  11/28/2018 FINDINGS: The cardiomediastinal contours are normal. The lungs are clear. Pulmonary vasculature is normal. No consolidation, pleural effusion, or pneumothorax. No acute osseous abnormalities are seen. IMPRESSION: Unremarkable radiographs of the chest. Electronically Signed   By: Narda Rutherford M.D.   On: 02/01/2019 20:39    EKG: Independently reviewed.  Sinus tachycardia.  Assessment/Plan Active Problems:   SIRS (systemic inflammatory response syndrome) (HCC)   Chest pain   Essential hypertension    1. SIRS source not clear -blood cultures urine cultures have been sent.  Empiric antibiotics have been started.  Continue with IV  hydration.  Follow cultures procalcitonin and lactate levels.  Since patient complains of chest pain will check 2D echo sed rate CRP.  If patient continues to have fever may need CAT scan chest and abdomen pelvis.  In addition we will also check TSH. 2. Hypertension on Inderal which will be continued. 3. History of anxiety on Prozac and Lamictal.   DVT prophylaxis: SCDs for now until we make sure that 2D echo does not show any effusion. Code Status: Full code. Family Communication: Discussed with patient. Disposition Plan: Home. Consults called: None. Admission status: Inpatient.   Eduard Clos MD [facility] Pager [phone].  If 7PM-7AM, please contact night-coverage [email or web address] Password [email or web address]  02/02/2019, 3:20 AM

## 2019-02-02 NOTE — Progress Notes (Signed)
  Echocardiogram 2D Echocardiogram has been performed.  Leta Jungling M 02/02/2019, 1:54 PM

## 2019-02-02 NOTE — Progress Notes (Signed)
Pharmacy Antibiotic Note  Rose Gray is a 23 y.o. female admitted on 02/01/2019 with sepsis.  Pharmacy has been consulted for Vancomycin, cefepime dosing.  Plan: Vancomycin 2gm iv x1, then  Vancomycin 1250 mg IV Q 12 hrs. Goal AUC 400-550. Expected AUC: 453 SCr used: 0.85  Cefepime 2gm iv q8hr  Height: 5\' 7"  (170.2 cm) Weight: 190 lb (86.2 kg) IBW/kg (Calculated) : 61.6  Temp (24hrs), Avg:100.3 F (37.9 C), Min:98.2 F (36.8 C), Max:102.3 F (39.1 C)  Recent Labs  Lab 02/01/19 1942 02/01/19 2332  WBC 12.7*  --   CREATININE 0.85  --   LATICACIDVEN 3.4* 1.8    Estimated Creatinine Clearance: 118 mL/min (by C-G formula based on SCr of 0.85 mg/dL).    Allergies  Allergen Reactions  . Vancomycin Itching and Other (See Comments)    Became flushed in the face and itching and redness on scalp    Antimicrobials this admission: Vancomycin 02/02/2019 >> Cefepime 02/02/2019 >>   Dose adjustments this admission: -  Microbiology results: -  Thank you for allowing pharmacy to be a part of this patient's care.  Aleene Davidson Crowford 02/02/2019 6:01 AM

## 2019-02-03 DIAGNOSIS — A419 Sepsis, unspecified organism: Secondary | ICD-10-CM

## 2019-02-03 LAB — URINE CULTURE

## 2019-02-03 MED ORDER — BENZONATATE 100 MG PO CAPS: 100.0000 mg | ORAL_CAPSULE | Freq: Three times a day (TID) | ORAL | 0 refills | Status: DC | PRN

## 2019-02-03 MED ORDER — SALINE SPRAY 0.65 % NA SOLN: 1.0000 | NASAL | 0 refills | Status: DC | PRN

## 2019-02-03 MED ORDER — OSELTAMIVIR PHOSPHATE 75 MG PO CAPS: 75.0000 mg | ORAL_CAPSULE | Freq: Two times a day (BID) | ORAL | 0 refills | Status: AC

## 2019-02-03 NOTE — Discharge Summary (Signed)
Physician Discharge Summary  Rose Gray Spero Geralds ZOX:[id number] DOB: 04/30/1996 DOA: 02/01/2019  PCP: Medicine, [facility]  Admit date: 02/01/2019 Discharge date: 02/03/2019  Admitted From: Home Disposition:  Home  Recommendations for Outpatient Follow-up:  1. Follow up with PCP in 1-2 weeks  Discharge Condition:Improved CODE STATUS:Full Diet recommendation: Regular   Brief/Interim Summary: 23 y.o.femalewithhistory of hypertension, anxiety presents to the ER at [facility] with complaints of chest pain and shortness of breath with productive cough since yesterday morning. Night before this patient has been on around bonfire. Denies any recent travel or sick contacts. Patient states she gets short of breath on exertion with substernal chest pain which increases on coughing and deep breathing. Denies nausea vomiting abdominal pain diarrhea. Has not taken her Inderal which she takes for hypertension yesterday.  Patient states that 3 months ago in December 2019 patient was treated for viral infection.  About 3 weeks ago patient had taken some over-the-counter Biotene for hair loss and had rash briefly. Rash has resolved. Denies any insect bite joint swelling.  Denies any neck pain or headache or visual symptoms  ED Course:In the ER patient was tachycardic with heart rate around 150 temperature of 102.8 lactic acid of 3.4 WBC count of 12,000. Chest x-ray was unremarkable UA was not showing any signs for infection. EKG was showing sinus tachycardia. D-dimer was negative. Patient was not started on fluid bolus for sepsis and empiric antibiotics. Admitted for further management.   Discharge Diagnoses:  Active Problems:   SIRS (systemic inflammatory response syndrome) (HCC)   Chest pain   Essential hypertension  1. Sepsis with rhinovirus and flu A 1. Had been on azithro and rocephin. CXR read and was clear 2. Respiratory viral panel reviewed,  found to be pos for rhinovirus and flu A 3. Continued on IVF hydration 4. Fevers resolved. Patient remained medically stable 2. HTN 1. BP currently stable 3. History of anxiety 1. Continue on Prozac and Lamictal 2. Stable at present  Discharge Instructions   Allergies as of 02/03/2019      Reactions   Vancomycin Itching, Other (See Comments)   Became flushed in the face and itching and redness on scalp      Medication List    STOP taking these medications   HYDROcodone-acetaminophen 7.5-325 mg/15 ml solution Commonly known as:  HYCET     TAKE these medications   benzonatate 100 MG capsule Commonly known as:  TESSALON PERLES Take 1 capsule (100 mg total) by mouth 3 (three) times daily as needed for cough.   FLUoxetine 20 MG capsule Commonly known as:  PROZAC Take 20 mg by mouth daily.   lamoTRIgine 25 MG tablet Commonly known as:  LAMICTAL Take 25 mg by mouth daily.   Norgestimate-Ethinyl Estradiol Triphasic 0.18/0.215/0.25 MG-25 MCG tab Take 1 tablet by mouth daily.   oseltamivir 75 MG capsule Commonly known as:  TAMIFLU Take 1 capsule (75 mg total) by mouth 2 (two) times daily for 4 days.   propranolol ER 60 MG 24 hr capsule Commonly known as:  INDERAL LA Take 60 mg by mouth daily.   sodium chloride 0.65 % Soln nasal spray Commonly known as:  OCEAN Place 1 spray into both nostrils as needed for congestion.      Follow-up Information    Medicine, [facility]. Schedule an appointment as soon as possible for a visit in 1 week(s).   Specialty:  Family Medicine  Allergies  Allergen Reactions  . Vancomycin Itching and Other (See Comments)    Became flushed in the face and itching and redness on scalp     Procedures/Studies: Dg Chest 2 View  Result Date: 02/01/2019 CLINICAL DATA:  Cough and shortness of breath. Awoke with chest pain. EXAM: CHEST - 2 VIEW COMPARISON:  11/28/2018 FINDINGS: The cardiomediastinal contours are  normal. The lungs are clear. Pulmonary vasculature is normal. No consolidation, pleural effusion, or pneumothorax. No acute osseous abnormalities are seen. IMPRESSION: Unremarkable radiographs of the chest. Electronically Signed   By: Narda Rutherford M.D.   On: 02/01/2019 20:39     Subjective: Eager to go home  Discharge Exam: Vitals:   02/02/19 2136 02/03/19 0608  BP: 121/73 130/87  Pulse: 74 94  Resp: 18 18  Temp: 98.2 F (36.8 C) 99.1 F (37.3 C)  SpO2: 100% 96%   Vitals:   02/02/19 1600 02/02/19 1836 02/02/19 2136 02/03/19 0608  BP: 124/86 (!) 150/85 121/73 130/87  Pulse: 91 100 74 94  Resp: 19 16 18 18   Temp: 99.2 F (37.3 C) 100 F (37.8 C) 98.2 F (36.8 C) 99.1 F (37.3 C)  TempSrc: Oral Oral Oral Oral  SpO2: 99% 98% 100% 96%  Weight:  90 kg    Height:  5\' 7"  (1.702 m)      General: Pt is alert, awake, not in acute distress Cardiovascular: RRR, S1/S2 +, no rubs, no gallops Respiratory: CTA bilaterally, no wheezing, no rhonchi Abdominal: Soft, NT, ND, bowel sounds + Extremities: no edema, no cyanosis   The results of significant diagnostics from this hospitalization (including imaging, microbiology, ancillary and laboratory) are listed below for reference.     Microbiology: Recent Results (from the past 240 hour(s))  Culture, blood (routine x 2)     Status: None (Preliminary result)   Collection Time: 02/01/19  7:35 PM  Result Value Ref Range Status   Specimen Description   Final    BLOOD BLOOD LEFT FOREARM Performed at [facility], [address] [place], [place] [address]    Special Requests   Final    BOTTLES DRAWN AEROBIC AND ANAEROBIC Blood Culture results may not be optimal due to an inadequate volume of blood received in culture bottles Performed at [facility], [address] [place], [place] [address]    Culture   Final    NO GROWTH < 24 HOURS Performed at [facility], [address] [place], [place]  [address]    Report Status PENDING  Incomplete  Urine culture     Status: None   Collection Time: 02/01/19  7:42 PM  Result Value Ref Range Status   Specimen Description   Final    URINE, CATHETERIZED Performed at [facility], [address] [place], [place] [address]    Special Requests NONE  Final   Culture   Final    Multiple bacterial morphotypes present, none predominant. Suggest appropriate recollection if clinically indicated.   Report Status 02/03/2019 FINAL  Final  Culture, blood (routine x 2)     Status: None (Preliminary result)   Collection Time: 02/01/19  7:50 PM  Result Value Ref Range Status   Specimen Description   Final    BLOOD BLOOD RIGHT HAND Performed at [facility], [address] [place], [place] [address]    Special Requests   Final    BOTTLES DRAWN AEROBIC AND ANAEROBIC  Blood Culture adequate volume Performed at [facility], [address] [place], [place] [address]    Culture   Final    NO GROWTH < 24 HOURS Performed at [facility], [address] [place], [place] [address]    Report Status PENDING  Incomplete  MRSA PCR Screening     Status: None   Collection Time: 02/02/19  1:09 AM  Result Value Ref Range Status   MRSA by PCR NEGATIVE NEGATIVE Final    Comment:        The GeneXpert MRSA Assay (FDA approved for NASAL specimens only), is one component of a comprehensive MRSA colonization surveillance program. It is not intended to diagnose MRSA infection nor to guide or monitor treatment for MRSA infections. Performed at [facility], [address] [place], [place] [address]   Respiratory Panel by PCR     Status: Abnormal   Collection Time: 02/02/19  2:06 AM  Result Value Ref Range Status   Adenovirus NOT DETECTED NOT DETECTED Final   Coronavirus 229E NOT DETECTED NOT DETECTED Final    Comment: (NOTE) The Coronavirus on the Respiratory Panel, DOES NOT test for the novel   Coronavirus (2019 nCoV)    Coronavirus HKU1 NOT DETECTED NOT DETECTED Final   Coronavirus NL63 NOT DETECTED NOT DETECTED Final   Coronavirus OC43 NOT DETECTED NOT DETECTED Final   Metapneumovirus NOT DETECTED NOT DETECTED Final   Rhinovirus / Enterovirus DETECTED (A) NOT DETECTED Final   Influenza A EQUIVOCAL (A) NOT DETECTED Final    Comment: Referred to [facility] in [place], [place] for serotyping.   Influenza B NOT DETECTED NOT DETECTED Final   Parainfluenza Virus 1 NOT DETECTED NOT DETECTED Final   Parainfluenza Virus 2 NOT DETECTED NOT DETECTED Final   Parainfluenza Virus 3 NOT DETECTED NOT DETECTED Final   Parainfluenza Virus 4 NOT DETECTED NOT DETECTED Final   Respiratory Syncytial Virus NOT DETECTED NOT DETECTED Final   Bordetella pertussis NOT DETECTED NOT DETECTED Final   Chlamydophila pneumoniae NOT DETECTED NOT DETECTED Final   Mycoplasma pneumoniae NOT DETECTED NOT DETECTED Final    Comment: Performed at [facility], [address], [place], [place] [address]     Labs: BNP (last 3 results) No results for input(s): BNP in the last 8760 hours. Basic Metabolic Panel: Recent Labs  Lab 02/01/19 1942 02/02/19 0801  NA 134* 137  K 3.5 3.3*  CL 103 106  CO2 20* 21*  GLUCOSE 117* 125*  BUN 8 5*  CREATININE 0.85 0.81  CALCIUM 9.4 8.3*   Liver Function Tests: Recent Labs  Lab 02/01/19 1942 02/02/19 0801  AST 28 24  ALT 20 18  ALKPHOS 64 50  BILITOT 0.5 0.6  PROT 7.9 6.3*  ALBUMIN 4.3 3.3*   No results for input(s): LIPASE, AMYLASE in the last 168 hours. No results for input(s): AMMONIA in the last 168 hours. CBC: Recent Labs  Lab 02/01/19 1942 02/02/19 0801  WBC 12.7* 9.8  NEUTROABS 11.0* 7.6  HGB 13.5 11.8*  HCT 41.2 36.8  MCV 94.5 98.1  PLT 422* 358   Cardiac Enzymes: Recent Labs  Lab 02/01/19 1942 02/02/19 0801 02/02/19 1028 02/02/19 1542  CKTOTAL  --  49  --   --   TROPONINI <0.03 <0.03 <0.03 <0.03   BNP: Invalid input(s):  POCBNP CBG: No results for input(s): GLUCAP in the last 168 hours. D-Dimer Recent Labs    02/01/19 1942  DDIMER <0.27   Hgb A1c No results for input(s): HGBA1C in the last 72 hours. Lipid Profile No results for input(s): CHOL, HDL, LDLCALC, TRIG, CHOLHDL, LDLDIRECT in the last 72 hours. Thyroid function studies Recent Labs    02/02/19 0801  TSH 0.701   Anemia work up No results for input(s): VITAMINB12, FOLATE, FERRITIN, TIBC, IRON, RETICCTPCT in the last 72 hours. Urinalysis    Component Value Date/Time   COLORURINE YELLOW 02/01/2019 1942   APPEARANCEUR CLOUDY (A) 02/01/2019 1942   LABSPEC <1.005 (L) 02/01/2019 1942   PHURINE 6.5 02/01/2019 1942   GLUCOSEU 100 (A) 02/01/2019 1942   HGBUR NEGATIVE 02/01/2019 1942   BILIRUBINUR NEGATIVE 02/01/2019 1942   KETONESUR NEGATIVE 02/01/2019 1942   PROTEINUR NEGATIVE 02/01/2019 1942   NITRITE NEGATIVE 02/01/2019 1942   LEUKOCYTESUR NEGATIVE 02/01/2019 1942   Sepsis Labs Invalid input(s): PROCALCITONIN,  WBC,  LACTICIDVEN Microbiology Recent Results (from the past 240 hour(s))  Culture, blood (routine x 2)     Status: None (Preliminary result)   Collection Time: 02/01/19  7:35 PM  Result Value Ref Range Status   Specimen Description   Final    BLOOD BLOOD LEFT FOREARM Performed at [facility], [address] [place], [place] [address]    Special Requests   Final    BOTTLES DRAWN AEROBIC AND ANAEROBIC Blood Culture results may not be optimal due to an inadequate volume of blood received in culture bottles Performed at [facility], [address] [place], [place] [address]    Culture   Final    NO GROWTH < 24 HOURS Performed at [facility], [address] [place], [place] [address]    Report Status PENDING  Incomplete  Urine culture     Status: None   Collection Time: 02/01/19  7:42 PM  Result Value Ref Range Status   Specimen Description   Final    URINE, CATHETERIZED Performed at  [facility], [address] [place], [place] [address]    Special Requests NONE  Final   Culture   Final    Multiple bacterial morphotypes present, none predominant. Suggest appropriate recollection if clinically indicated.   Report Status 02/03/2019 FINAL  Final  Culture, blood (routine x 2)     Status: None (Preliminary result)   Collection Time: 02/01/19  7:50 PM  Result Value Ref Range Status   Specimen Description   Final    BLOOD BLOOD RIGHT HAND Performed at [facility], [address] [place], [place] [address]    Special Requests   Final    BOTTLES DRAWN AEROBIC AND ANAEROBIC Blood Culture adequate volume Performed at [facility], [address] [place], [place] [address]    Culture   Final    NO GROWTH < 24 HOURS Performed at [facility], [address], [place], [place] [address]    Report Status PENDING  Incomplete  MRSA PCR Screening     Status: None   Collection Time: 02/02/19  1:09 AM  Result Value Ref Range Status   MRSA by PCR NEGATIVE NEGATIVE Final    Comment:        The GeneXpert MRSA Assay (FDA approved for NASAL specimens only), is one component of a comprehensive MRSA colonization surveillance program. It is not intended to diagnose MRSA infection nor to guide or monitor treatment for MRSA infections. Performed at [facility],  [address], [place], [place] [address]   Respiratory Panel by PCR     Status: Abnormal   Collection Time: 02/02/19  2:06 AM  Result Value Ref Range Status   Adenovirus NOT DETECTED NOT DETECTED Final   Coronavirus 229E NOT DETECTED NOT DETECTED Final    Comment: (NOTE) The Coronavirus on the Respiratory Panel, DOES NOT test for the novel  Coronavirus (2019 nCoV)    Coronavirus HKU1 NOT DETECTED NOT DETECTED Final   Coronavirus NL63 NOT DETECTED NOT DETECTED Final   Coronavirus OC43 NOT DETECTED NOT DETECTED Final   Metapneumovirus NOT DETECTED NOT  DETECTED Final   Rhinovirus / Enterovirus DETECTED (A) NOT DETECTED Final   Influenza A EQUIVOCAL (A) NOT DETECTED Final    Comment: Referred to [facility] in [place], [place] for serotyping.   Influenza B NOT DETECTED NOT DETECTED Final   Parainfluenza Virus 1 NOT DETECTED NOT DETECTED Final   Parainfluenza Virus 2 NOT DETECTED NOT DETECTED Final   Parainfluenza Virus 3 NOT DETECTED NOT DETECTED Final   Parainfluenza Virus 4 NOT DETECTED NOT DETECTED Final   Respiratory Syncytial Virus NOT DETECTED NOT DETECTED Final   Bordetella pertussis NOT DETECTED NOT DETECTED Final   Chlamydophila pneumoniae NOT DETECTED NOT DETECTED Final   Mycoplasma pneumoniae NOT DETECTED NOT DETECTED Final    Comment: Performed at [facility], [address], [place], [place] [address]   Time spent: 30 min  SIGNED:   Rickey Barbara, MD  [facility] 02/03/2019, 10:54 AM  If 7PM-7AM, please contact night-coverage

## 2019-02-03 NOTE — Progress Notes (Signed)
D/C to home with family member maintaining droplet precautions.  Pt in stable condition.

## 2019-02-06 LAB — CULTURE, BLOOD (ROUTINE X 2)
Culture: NO GROWTH
Culture: NO GROWTH
SPECIAL REQUESTS: ADEQUATE

## 2019-03-17 DIAGNOSIS — Z9089 Acquired absence of other organs: Secondary | ICD-10-CM | POA: Insufficient documentation

## 2019-08-03 ENCOUNTER — Ambulatory Visit: Payer: Managed Care, Other (non HMO) | Admitting: Internal Medicine

## 2019-08-07 ENCOUNTER — Other Ambulatory Visit: Payer: Self-pay

## 2019-08-07 ENCOUNTER — Encounter: Payer: Self-pay | Admitting: Emergency Medicine

## 2019-08-07 ENCOUNTER — Emergency Department
Admission: EM | Admit: 2019-08-07 | Discharge: 2019-08-07 | Disposition: A | Discharge status: Home / Self Care | Payer: Managed Care, Other (non HMO) | Source: Home / Self Care

## 2019-08-07 DIAGNOSIS — R55 Syncope and collapse: Secondary | ICD-10-CM | POA: Diagnosis not present

## 2019-08-07 DIAGNOSIS — R42 Dizziness and giddiness: Secondary | ICD-10-CM

## 2019-08-07 LAB — POCT CBC W AUTO DIFF (K'VILLE URGENT CARE)

## 2019-08-07 LAB — POCT URINALYSIS DIP (MANUAL ENTRY)
Bilirubin, UA: NEGATIVE
Blood, UA: NEGATIVE
Glucose, UA: NEGATIVE mg/dL
Ketones, POC UA: NEGATIVE mg/dL
Nitrite, UA: NEGATIVE
Protein Ur, POC: NEGATIVE mg/dL
Spec Grav, UA: 1.025 (ref 1.010–1.025)
Urobilinogen, UA: 0.2 E.U./dL
pH, UA: 6.5 (ref 5.0–8.0)

## 2019-08-07 LAB — POCT URINE PREGNANCY: Preg Test, Ur: NEGATIVE

## 2019-08-07 MED ORDER — MECLIZINE HCL 25 MG PO TABS: 25.0000 mg | ORAL_TABLET | Freq: Three times a day (TID) | ORAL | 0 refills | Status: DC | PRN

## 2019-08-07 NOTE — ED Provider Notes (Signed)
[facility]    CSN: [id number] Arrival date & time: 08/07/19  1154      History   Chief Complaint Chief Complaint  Patient presents with  . Hypertension    HPI Quenten RavenKatelyn P Spero GeraldsFriddle is a 23 y.o. female.   HPI Quenten RavenKatelyn P Spero GeraldsFriddle is a 23 y.o. female presenting to UC with c/o 1 week of mild chest discomfort. Two days ago she was sitting in a chair at home and developed ringing in her ears before passing out for a few seconds. She was seen by her PCP yesterday due to continued dizziness and nausea.  She has been referred to a neurologist, scheduled for Tuesday, 08/11/2019.  Pt concerned she still dizzy and nausea, she did not want to wait all weekend for a further workup.  Pt denies chest pain or palpitations at this time. Her father did have a heart attack recently and her grandmother died of a heart issue at 22yo.  Pt does take propranolol for HTN but here PCP advised her to stop taking due to her current symptoms.  Pt denies headache, change in vision or vomiting. No hx of similar symptoms. No recent illness.    Past Medical History:  Diagnosis Date  . Asthma    childhood  . Hypertension     Patient Active Problem List   Diagnosis Date Noted  . Chest pain 02/02/2019  . Essential hypertension 02/02/2019  . SIRS (systemic inflammatory response syndrome) (HCC) 02/01/2019  . Chronic tonsillitis 04/25/2018    Past Surgical History:  Procedure Laterality Date  . NO PAST SURGERIES    . TONSILLECTOMY N/A 04/25/2018   Procedure: TONSILLECTOMY;  Surgeon: Christia ReadingBates, Dwight, MD;  [facility];  Service: ENT;  Laterality: N/A;    OB History   No obstetric history on file.      Home Medications    Prior to Admission medications   Medication Sig Start Date End Date Taking? Authorizing Provider  FLUoxetine (PROZAC) 20 MG capsule Take 20 mg by mouth daily.    [provider]  lamoTRIgine (LAMICTAL) 25 MG tablet Take 25 mg by mouth daily.    [provider]   meclizine (ANTIVERT) 25 MG tablet Take 1 tablet (25 mg total) by mouth 3 (three) times daily as needed for dizziness. 08/07/19   Lurene ShadowPhelps, Triton Heidrich O, PA-C  Norgestimate-Ethinyl Estradiol Triphasic 0.18/0.215/0.25 MG-25 MCG tab Take 1 tablet by mouth daily. 08/09/17   [provider]  propranolol ER (INDERAL LA) 60 MG 24 hr capsule Take 60 mg by mouth daily. 12/09/18   [provider]  sodium chloride (OCEAN) 0.65 % SOLN nasal spray Place 1 spray into both nostrils as needed for congestion. 02/03/19   Jerald Kiefhiu, Stephen K, MD    Family History Family History  Problem Relation Age of Onset  . Diabetes Mellitus II Maternal Grandmother   . Heart attack Father     Social History Social History   Tobacco Use  . Smoking status: Passive Smoke Exposure - Never Smoker  . Smokeless tobacco: Never Used  Substance Use Topics  . Alcohol use: No  . Drug use: No     Allergies   Vancomycin   Review of Systems Review of Systems  Constitutional: Negative for chills, diaphoresis and fever.  HENT: Negative for congestion, ear pain, sore throat, trouble swallowing and voice change.   Respiratory: Negative for cough, chest tightness and shortness of breath.   Cardiovascular: Negative for chest pain and palpitations.  Gastrointestinal: Positive  for nausea. Negative for abdominal pain, diarrhea and vomiting.  Musculoskeletal: Negative for arthralgias, back pain, myalgias, neck pain and neck stiffness.  Skin: Negative for rash.  Neurological: Positive for dizziness and syncope (2 days ago). Negative for tremors, seizures, facial asymmetry, speech difficulty, weakness, light-headedness, numbness and headaches.     Physical Exam Triage Vital Signs ED Triage Vitals  Enc Vitals Group     BP 08/07/19 1235 (!) 136/93     Pulse Rate 08/07/19 1235 78     Resp --      Temp 08/07/19 1235 98 F (36.7 C)     Temp Source 08/07/19 1235 Oral     SpO2 08/07/19 1235 97 %     Weight 08/07/19 1236 200 lb  (90.7 kg)     Height 08/07/19 1236 5\' 8"  (1.727 m)     Head Circumference --      Peak Flow --      Pain Score 08/07/19 1235 0     Pain Loc --      Pain Edu? --      Excl. in GC? --    Orthostatic VS for the past 24 hrs:  BP- Lying Pulse- Lying BP- Sitting Pulse- Sitting BP- Standing at 0 minutes Pulse- Standing at 0 minutes  08/07/19 1333 124/87 66 119/79 76 (!) 133/96 82    Updated Vital Signs BP (!) 136/93 (BP Location: Right Arm)   Pulse 78   Temp 98 F (36.7 C) (Oral)   Ht 5\' 8"  (1.727 m)   Wt 200 lb (90.7 kg)   LMP 07/24/2019 (Approximate)   SpO2 97%   BMI 30.41 kg/m     Physical Exam Vitals signs and nursing note reviewed.  Constitutional:      General: She is not in acute distress.    Appearance: Normal appearance. She is well-developed. She is not ill-appearing, toxic-appearing or diaphoretic.     Comments: Pt sitting on exam table, NAD. Alert and cooperative during exam.  HENT:     Head: Normocephalic and atraumatic.     Right Ear: Tympanic membrane and ear canal normal.     Left Ear: Tympanic membrane and ear canal normal.     Nose: Nose normal.     Mouth/Throat:     Mouth: Mucous membranes are moist.  Eyes:     Extraocular Movements: Extraocular movements intact.     Conjunctiva/sclera: Conjunctivae normal.     Pupils: Pupils are equal, round, and reactive to light.  Neck:     Musculoskeletal: Normal range of motion and neck supple.  Cardiovascular:     Rate and Rhythm: Normal rate and regular rhythm.  Pulmonary:     Effort: Pulmonary effort is normal.     Breath sounds: Normal breath sounds.  Musculoskeletal: Normal range of motion.  Skin:    General: Skin is warm and dry.     Capillary Refill: Capillary refill takes less than 2 seconds.  Neurological:     General: No focal deficit present.     Mental Status: She is alert and oriented to person, place, and time.     Cranial Nerves: No cranial nerve deficit.     Sensory: No sensory deficit.      Motor: No weakness.     Coordination: Coordination normal.     Gait: Gait normal.  Psychiatric:        Mood and Affect: Mood normal.        Behavior: Behavior normal.  UC Treatments / Results  Labs (all labs ordered are listed, but only abnormal results are displayed) Labs Reviewed  POCT URINALYSIS DIP (MANUAL ENTRY) - Abnormal; Notable for the following components:      Result Value   Leukocytes, UA Trace (*)    All other components within normal limits  URINE CULTURE  COMPLETE METABOLIC PANEL WITH GFR  POCT CBC W AUTO DIFF ([facility])  POCT URINE PREGNANCY    EKG Normal sinus rhythm, normal EKG See scanned EKG  Radiology No results found.  Procedures Procedures (including critical care time)  Medications Ordered in UC Medications - No data to display  Initial Impression / Assessment and Plan / UC Course  I have reviewed the triage vital signs and the nursing notes.  Pertinent labs & imaging results that were available during my care of the patient were reviewed by me and considered in my medical decision making (see chart for details).     UA: trace leukocytes, culture sent, pt denies urinary symptoms, will hold off on empiric tx at this time. Workup otherwise unremarkable. Will have pt try trial of meclizine for reported dizziness and nausea. Encouraged f/u next week as previously scheduled Discussed symptoms that warrant emergent care in the ED.  Final Clinical Impressions(s) / UC Diagnoses   Final diagnoses:  Dizziness  Syncope, unspecified syncope type     Discharge Instructions      Your workup today was normal.  Your symptoms could be due to a mild case of vertigo, however, it is still recommended you follow up next week with the neurologist as previously scheduled for a more in-depth workup.    In the meantime, you can try the prescribed meclizine to help with dizziness and nausea that may be caused by vertigo. Antivert  (meclizine) is a medication to help with dizziness and nausea related to vertigo.  This medication can cause drowsiness. Do not operate heavy machinery or drive while taking.    Please also follow up with your family doctor as they may refer you to a cardiologist for an event monitor to see if your heart is changing rhythms causing your symptoms, but your EKG was normal today.    Call 911 or have someone drive you to the hospital if symptoms worsening or new symptoms develop- worsening dizziness, headache, change in vision, unable to keep down fluids, passing out again, chest pain, palpitations (feels like your heart is racing or skipping beats) or other concerning symptoms develop.     ED Prescriptions    Medication Sig Dispense Auth. Provider   meclizine (ANTIVERT) 25 MG tablet Take 1 tablet (25 mg total) by mouth 3 (three) times daily as needed for dizziness. 30 tablet Noe Gens, PA-C     Controlled Substance Prescriptions [place] Controlled Substance Registry consulted? Not Applicable   Tyrell Antonio 08/07/19 1617

## 2019-08-07 NOTE — ED Triage Notes (Signed)
Chest discomfort x 1 week, LOC 2 days ago, ringing in ears, dizziness, nausea. Saw PCP yesterday and has a referral to Neurology in 4 days but did not want to wait.

## 2019-08-07 NOTE — Discharge Instructions (Addendum)
°  Your workup today was normal.  Your symptoms could be due to a mild case of vertigo, however, it is still recommended you follow up next week with the neurologist as previously scheduled for a more in-depth workup.    In the meantime, you can try the prescribed meclizine to help with dizziness and nausea that may be caused by vertigo. Antivert (meclizine) is a medication to help with dizziness and nausea related to vertigo.  This medication can cause drowsiness. Do not operate heavy machinery or drive while taking.    Please also follow up with your family doctor as they may refer you to a cardiologist for an event monitor to see if your heart is changing rhythms causing your symptoms, but your EKG was normal today.    Call 911 or have someone drive you to the hospital if symptoms worsening or new symptoms develop- worsening dizziness, headache, change in vision, unable to keep down fluids, passing out again, chest pain, palpitations (feels like your heart is racing or skipping beats) or other concerning symptoms develop.

## 2019-08-08 LAB — COMPLETE METABOLIC PANEL WITH GFR
AG Ratio: 1.5 (calc) (ref 1.0–2.5)
ALT: 18 U/L (ref 6–29)
AST: 14 U/L (ref 10–30)
Albumin: 4.3 g/dL (ref 3.6–5.1)
Alkaline phosphatase (APISO): 52 U/L (ref 31–125)
BUN: 12 mg/dL (ref 7–25)
CO2: 22 mmol/L (ref 20–32)
Calcium: 10.2 mg/dL (ref 8.6–10.2)
Chloride: 105 mmol/L (ref 98–110)
Creat: 0.86 mg/dL (ref 0.50–1.10)
GFR, Est African American: 111 mL/min/{1.73_m2} (ref >=60)
GFR, Est Non African American: 96 mL/min/{1.73_m2} (ref >=60)
Globulin: 2.9 g/dL (calc) (ref 1.9–3.7)
Glucose, Bld: 83 mg/dL (ref 65–99)
Potassium: 4.4 mmol/L (ref 3.5–5.3)
Sodium: 138 mmol/L (ref 135–146)
Total Bilirubin: 0.4 mg/dL (ref 0.2–1.2)
Total Protein: 7.2 g/dL (ref 6.1–8.1)

## 2019-08-09 ENCOUNTER — Telehealth: Payer: Self-pay | Admitting: Emergency Medicine

## 2019-08-09 LAB — URINE CULTURE
MICRO NUMBER:: 849652
Result:: NO GROWTH
SPECIMEN QUALITY:: ADEQUATE

## 2019-08-09 NOTE — Telephone Encounter (Signed)
Message left on voice mail inquiring about patient's status and encouraging patient to call with questions/concerns; lab results nml.

## 2019-09-04 ENCOUNTER — Encounter: Payer: Self-pay | Admitting: Internal Medicine

## 2019-09-04 ENCOUNTER — Other Ambulatory Visit (INDEPENDENT_AMBULATORY_CARE_PROVIDER_SITE_OTHER): Payer: Managed Care, Other (non HMO)

## 2019-09-04 ENCOUNTER — Ambulatory Visit: Payer: Managed Care, Other (non HMO) | Admitting: Internal Medicine

## 2019-09-04 VITALS — BP 110/62 | HR 94 | Temp 98.0°F | Ht 68.0 in | Wt 212.6 lb

## 2019-09-04 DIAGNOSIS — R109 Unspecified abdominal pain: Secondary | ICD-10-CM

## 2019-09-04 DIAGNOSIS — R197 Diarrhea, unspecified: Secondary | ICD-10-CM

## 2019-09-04 DIAGNOSIS — R1310 Dysphagia, unspecified: Secondary | ICD-10-CM | POA: Diagnosis not present

## 2019-09-04 DIAGNOSIS — K219 Gastro-esophageal reflux disease without esophagitis: Secondary | ICD-10-CM

## 2019-09-04 LAB — IGA: IgA: 83 mg/dL (ref 68–378)

## 2019-09-04 MED ORDER — NA SULFATE-K SULFATE-MG SULF 17.5-3.13-1.6 GM/177ML PO SOLN: 1.0000 | Freq: Once | ORAL | 0 refills | Status: AC

## 2019-09-04 MED ORDER — OMEPRAZOLE 40 MG PO CPDR: 40.0000 mg | DELAYED_RELEASE_CAPSULE | Freq: Every day | ORAL | 11 refills | Status: DC

## 2019-09-04 NOTE — Patient Instructions (Addendum)
Your provider has requested that you go to the basement level for lab work before leaving today. Press "B" on the elevator. The lab is located at the first door on the left as you exit the elevator.   We have sent the following medications to your pharmacy for you to pick up at your convenience:  Omeprazole  You have been scheduled for an endoscopy and colonoscopy. Please follow the written instructions given to you at your visit today. Please pick up your prep supplies at the pharmacy within the next 1-3 days.

## 2019-09-04 NOTE — Progress Notes (Signed)
HISTORY OF PRESENT ILLNESS:  Rose Gray is a 23 y.o. 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  First, the patient reports at least a 2-year history of chronic diarrhea.  She reports at least 4-5 loose bowel movements per day.  May occur anytime of the day.  Not necessarily exacerbated by meals.  On one occasion she noticed that her stool was dark.  This concerned her.  It is rare for her to have nocturnal symptoms.  She does experience abdominal cramping pain prior to defecating and during the active defecating.  Relief thereafter.  No bleeding.  Her mother has a history of Crohn's disease.  The patient did undergo blood work March 2020 and was found to be mildly anemic with hemoglobin 11.8.  Also mild elevation of C-reactive protein at 7.1.  More recent lab September 2020 finds a normal comprehensive metabolic panel.  Patient reports weight gain over time.  She does experience bloating.  Next, she complains of longstanding issues with heartburn and indigestion.  Occasionally takes antacids.  She also has intermittent solid food dysphasia once or twice per week.  She has not had this evaluated.  She does not smoke or use alcohol.  Occasional diet drinks.  REVIEW OF SYSTEMS:  All non-GI ROS negative unless otherwise stated in the HPI except for menstrual cramps, muscle cramps, cough  Past Medical History:  Diagnosis Date  . Asthma    childhood  . Hypertension     Past Surgical History:  Procedure Laterality Date  . NO PAST SURGERIES    . TONSILLECTOMY N/A 04/25/2018   Procedure: TONSILLECTOMY;  Surgeon: Melida Quitter, MD;  Location: [place];  Service: ENT;  Laterality: N/A;    Social History Lily Kocher Luedke  reports that she is a non-smoker but has been exposed to tobacco smoke. She has never used smokeless tobacco. She reports that she does not drink alcohol or use drugs.  family history includes Colon polyps in  her paternal grandmother; Diabetes Mellitus II in her maternal grandmother; Diverticulitis in her mother; Heart attack in her father; Ulcerative colitis in her father.  Allergies  Allergen Reactions  . Vancomycin Itching and Other (See Comments)    Became flushed in the face and itching and redness on scalp       PHYSICAL EXAMINATION: Vital signs: BP 110/62   Pulse 94   Temp 98 F (36.7 C)   Ht 5\' 8"  (1.727 m)   Wt 212 lb 9.6 oz (96.4 kg)   SpO2 98%   BMI 32.33 kg/m   Constitutional: generally well-appearing, no acute distress Psychiatric: alert and oriented x3, cooperative Eyes: extraocular movements intact, anicteric, conjunctiva pink Mouth: oral pharynx moist, no lesions Neck: supple no lymphadenopathy Cardiovascular: heart regular rate and rhythm, no murmur Lungs: clear to auscultation bilaterally Abdomen: soft, nontender, nondistended, no obvious ascites, no peritoneal signs, normal bowel sounds, no organomegaly Rectal: Deferred until colonoscopy Extremities: no clubbing, cyanosis, or lower extremity edema bilaterally Skin: no lesions on visible extremities Neuro: No focal deficits.  Cranial nerves intact  ASSESSMENT:  1.  Chronic abdominal cramping with urgency and loose stools.  Possible etiologies include irritable bowel syndrome, celiac disease, bile salt related diarrhea, microscopic colitis, bacterial overgrowth, or IBD (mild anemia with mild elevation of CRP). 2.  Chronic GERD.  Significant active symptoms.  On no medical therapy 3.  Intermittent solid food dysphasia.  Suspect peptic stricture 4.  Obesity   PLAN:  1.  Reflux precautions 2.  Weight loss 3.  Prescribe omeprazole 40 mg daily.  Medication risks reviewed 4.  Screen for celiac disease with tissue transglutaminase antibody IgA and serum IgA level 5.  Schedule upper endoscopy with esophageal dilation.The nature of the procedure, as well as the risks, benefits, and alternatives were carefully and  thoroughly reviewed with the patient. Ample time for discussion and questions allowed. The patient understood, was satisfied, and agreed to proceed. 6.  Schedule colonoscopy with biopsies. The nature of the procedure, as well as the risks, benefits, and alternatives were carefully and thoroughly reviewed with the patient. Ample time for discussion and questions allowed. The patient understood, was satisfied, and agreed to proceed. 7.  Follow-up in additional recommendations to be determined after the above completed and results available.

## 2019-09-07 LAB — TISSUE TRANSGLUTAMINASE, IGA: (tTG) Ab, IgA: 1 U/mL

## 2019-09-22 ENCOUNTER — Encounter: Payer: Self-pay | Admitting: Internal Medicine

## 2019-09-24 ENCOUNTER — Telehealth: Payer: Self-pay

## 2019-09-24 NOTE — Telephone Encounter (Signed)
Covid-19 screening questions   Do you now or have you had a fever in the last 14 days?  Do you have any respiratory symptoms of shortness of breath or cough now or in the last 14 days?  Do you have any family members or close contacts with diagnosed or suspected Covid-19 in the past 14 days?  Have you been tested for Covid-19 and found to be positive?       

## 2019-09-25 ENCOUNTER — Other Ambulatory Visit: Payer: Self-pay | Admitting: Internal Medicine

## 2019-09-25 ENCOUNTER — Other Ambulatory Visit: Payer: Self-pay

## 2019-09-25 ENCOUNTER — Encounter: Payer: Self-pay | Admitting: Internal Medicine

## 2019-09-25 ENCOUNTER — Ambulatory Visit (AMBULATORY_SURGERY_CENTER): Payer: Managed Care, Other (non HMO) | Admitting: Internal Medicine

## 2019-09-25 VITALS — BP 114/66 | HR 79 | Temp 99.1°F | Resp 16 | Ht 68.0 in | Wt 212.0 lb

## 2019-09-25 DIAGNOSIS — R1310 Dysphagia, unspecified: Secondary | ICD-10-CM

## 2019-09-25 DIAGNOSIS — K219 Gastro-esophageal reflux disease without esophagitis: Secondary | ICD-10-CM | POA: Diagnosis present

## 2019-09-25 DIAGNOSIS — R109 Unspecified abdominal pain: Secondary | ICD-10-CM

## 2019-09-25 DIAGNOSIS — R197 Diarrhea, unspecified: Secondary | ICD-10-CM | POA: Diagnosis not present

## 2019-09-25 MED ORDER — COLESTIPOL HCL 1 G PO TABS: 2.0000 g | ORAL_TABLET | Freq: Two times a day (BID) | ORAL | 2 refills | Status: DC

## 2019-09-25 MED ORDER — SODIUM CHLORIDE 0.9 % IV SOLN: 500.0000 mL | Freq: Once | INTRAVENOUS | Status: DC

## 2019-09-25 NOTE — Op Note (Signed)
[facility] Patient Name: Rose Gray Procedure Date: 09/25/2019 2:53 PM MRN: [id number] Endoscopist: Wilhemina Bonito. Marina Goodell , MD Age: 23 Referring MD:  Date of Birth: 1996/11/10 Gender: Female Account #: [id number] Procedure:                Colonoscopy with biopsies. Indications:              Abdominal pain, Chronic diarrhea. Mild anemia mild                            elevated CRP. Mother with Crohn's Medicines:                Monitored Anesthesia Care Procedure:                Pre-Anesthesia Assessment:                           - Prior to the procedure, a History and Physical                            was performed, and patient medications and                            allergies were reviewed. The patient's tolerance of                            previous anesthesia was also reviewed. The risks                            and benefits of the procedure and the sedation                            options and risks were discussed with the patient.                            All questions were answered, and informed consent                            was obtained. Prior Anticoagulants: The patient has                            taken no previous anticoagulant or antiplatelet                            agents. ASA Grade Assessment: II - A patient with                            mild systemic disease. After reviewing the risks                            and benefits, the patient was deemed in                            satisfactory condition to undergo the procedure.  After obtaining informed consent, the colonoscope                            was passed under direct vision. Throughout the                            procedure, the patient's blood pressure, pulse, and                            oxygen saturations were monitored continuously. The                            Colonoscope was introduced through the anus and                            advanced  to the the cecum, identified by                            appendiceal orifice and ileocecal valve. The                            terminal ileum, ileocecal valve, appendiceal                            orifice, and rectum were photographed. The quality                            of the bowel preparation was excellent. The                            colonoscopy was performed without difficulty. The                            patient tolerated the procedure well. The bowel                            preparation used was SUPREP via split dose                            instruction. Scope In: 3:04:15 PM Scope Out: 3:12:11 PM Scope Withdrawal Time: 0 hours 6 minutes 3 seconds  Total Procedure Duration: 0 hours 7 minutes 56 seconds  Findings:                 The terminal ileum appeared normal.                           The entire examined colon appeared normal on direct                            and retroflexion views. Biopsies for histology were                            taken with a cold forceps from the entire colon for  evaluation of microscopic colitis. Complications:            No immediate complications. Estimated blood loss:                            None. Estimated Blood Loss:     Estimated blood loss: none. Impression:               - The examined portion of the ileum was normal.                           - The entire examined colon is normal on direct and                            retroflexion views. Recommendation:           1. Follow-up biopsies                           2. PRESCRIBE Colestid (colestipol) 1 g; #120; 2                            refills; take 2 g by mouth twice daily (this is for                            your chronic diarrhea)                           3. Routine screening colonoscopy at age 80                           4. EGD today. Please see report regarding findings                            and final recommendations                            5. Office follow-up with Dr. Henrene Gray in 6-8 weeks Rose Gray. Rose Pastor, MD 09/25/2019 3:24:37 PM This report has been signed electronically.

## 2019-09-25 NOTE — Progress Notes (Signed)
PT taken to PACU. Monitors in place. VSS. Report given to RN. 

## 2019-09-25 NOTE — Op Note (Signed)
[facility] Patient Name: Rose Gray Procedure Date: 09/25/2019 2:52 PM MRN: [id number] Endoscopist: Wilhemina Bonito. Marina Goodell , MD Age: 23 Referring MD:  Date of Birth: May 22, 1996 Gender: Female Account #: [id number] Procedure:                Upper GI endoscopy Indications:              Abdominal pain, Dysphagia, Esophageal reflux Medicines:                Monitored Anesthesia Care Procedure:                Pre-Anesthesia Assessment:                           - Prior to the procedure, a History and Physical                            was performed, and patient medications and                            allergies were reviewed. The patient's tolerance of                            previous anesthesia was also reviewed. The risks                            and benefits of the procedure and the sedation                            options and risks were discussed with the patient.                            All questions were answered, and informed consent                            was obtained. Prior Anticoagulants: The patient has                            taken no previous anticoagulant or antiplatelet                            agents. ASA Grade Assessment: II - A patient with                            mild systemic disease. After reviewing the risks                            and benefits, the patient was deemed in                            satisfactory condition to undergo the procedure.                           After obtaining informed consent, the endoscope was  passed under direct vision. Throughout the                            procedure, the patient's blood pressure, pulse, and                            oxygen saturations were monitored continuously. The                            Endoscope was introduced through the mouth, and                            advanced to the second part of duodenum. The upper                            GI  endoscopy was accomplished without difficulty.                            The patient tolerated the procedure well. Scope In: Scope Out: Findings:                 The esophagus was normal.                           The stomach was normal.                           The examined duodenum was normal.                           The cardia and gastric fundus were normal on                            retroflexion. Complications:            No immediate complications. Estimated Blood Loss:     Estimated blood loss: none. Impression:               1. Normal EGD                           2. GERD. Recommendation:           1. Please begin recently prescribed omeprazole 40                            mg daily                           2. Reflux precautions                           3. Routine office follow-up with Dr. Henrene Pastor in 6 to                            8 weeks. Docia Chuck. Henrene Pastor, MD 09/25/2019 3:26:42 PM This report has been signed electronically.

## 2019-09-25 NOTE — Patient Instructions (Signed)
HANDOUT PROVIDED ON:  GERD  THE BIOPSIES TAKEN TODAY HAVE BEEN SENT FOR PATHOLOGY.  THE RESULTS WILL TAKE 2-3 WEEKS TO RECEIVE.  BEGIN TAKING COLESTID 2g BY MOUTH 2 TIMES DAILY (order sent to pharmacy today) AND OMEPRAZOLE 40mg  DAILY (recently prescribed). FOLLOW REFLUX PRECAUTIONS.  YOU MAY RESUME YOUR PREVIOUS DIET AND MEDICATION SCHEDULE.  FOLLOW UP WITH DR. PERRY IN 6-8 WEEKS.  THANK YOU FOR ALLOWING TO CARE FOR YOU TODAY!!  YOU HAD AN ENDOSCOPIC PROCEDURE TODAY AT THE [facility]:   Refer to the procedure report that was given to you for any specific questions about what was found during the examination.  If the procedure report does not answer your questions, please call your gastroenterologist to clarify.  If you requested that your care partner not be given the details of your procedure findings, then the procedure report has been included in a sealed envelope for you to review at your convenience later.  YOU SHOULD EXPECT: Some feelings of bloating in the abdomen. Passage of more gas than usual.  Walking can help get rid of the air that was put into your GI tract during the procedure and reduce the bloating. If you had a lower endoscopy (such as a colonoscopy or flexible sigmoidoscopy) you may notice spotting of blood in your stool or on the toilet paper. If you underwent a bowel prep for your procedure, you may not have a normal bowel movement for a few days.  Please Note:  You might notice some irritation and congestion in your nose or some drainage.  This is from the oxygen used during your procedure.  There is no need for concern and it should clear up in a day or so.  SYMPTOMS TO REPORT IMMEDIATELY:   Following lower endoscopy (colonoscopy or flexible sigmoidoscopy):  Excessive amounts of blood in the stool  Significant tenderness or worsening of abdominal pains  Swelling of the abdomen that is new, acute  Fever of 100F or higher   Following upper endoscopy  (EGD)  Vomiting of blood or coffee ground material  New chest pain or pain under the shoulder blades  Painful or persistently difficult swallowing  New shortness of breath  Fever of 100F or higher  Black, tarry-looking stools  For urgent or emergent issues, a gastroenterologist can be reached at any hour by calling [phone].   DIET:  We do recommend a small meal at first, but then you may proceed to your regular diet.  Drink plenty of fluids but you should avoid alcoholic beverages for 24 hours.  ACTIVITY:  You should plan to take it easy for the rest of today and you should NOT DRIVE or use heavy machinery until tomorrow (because of the sedation medicines used during the test).    FOLLOW UP: Our staff will call the number listed on your records 48-72 hours following your procedure to check on you and address any questions or concerns that you may have regarding the information given to you following your procedure. If we do not reach you, we will leave a message.  We will attempt to reach you two times.  During this call, we will ask if you have developed any symptoms of COVID 19. If you develop any symptoms (ie: fever, flu-like symptoms, shortness of breath, cough etc.) before then, please call [phone].  If you test positive for Covid 19 in the 2 weeks post procedure, please call and report this information to [phone].    If  any biopsies were taken you will be contacted by phone or by letter within the next 1-3 weeks.  Please call us at [phone] if you have not heard about the biopsies in 3 weeks.    SIGNATURES/CONFIDENTIALITY: You and/or your care partner have signed paperwork which will be entered into your electronic medical record.  These signatures attest to the fact that that the information above on your After Visit Summary has been reviewed and is understood.  Full responsibility of the confidentiality of this discharge information lies with you and/or your  care-partner.

## 2019-09-25 NOTE — Telephone Encounter (Signed)
Pt answered "No" to all questios.

## 2019-09-25 NOTE — Progress Notes (Signed)
Called to room to assist during endoscopic procedure.  Patient ID and intended procedure confirmed with present staff. Received instructions for my participation in the procedure from the performing physician.  

## 2019-09-29 ENCOUNTER — Telehealth: Payer: Self-pay

## 2019-09-29 NOTE — Telephone Encounter (Signed)
Follow up call attempted.  NALM  

## 2019-09-30 ENCOUNTER — Encounter: Payer: Self-pay | Admitting: Internal Medicine

## 2019-11-10 ENCOUNTER — Ambulatory Visit: Payer: Managed Care, Other (non HMO) | Admitting: Internal Medicine

## 2020-01-04 IMAGING — CR DG CHEST 2V
2 series · 2 of 2 positions shown · non-contrast
Comparison: January 21, 2018

CLINICAL DATA: Flu-like symptoms for 2 days

EXAM:
CHEST - 2 VIEW

[w chest pa]
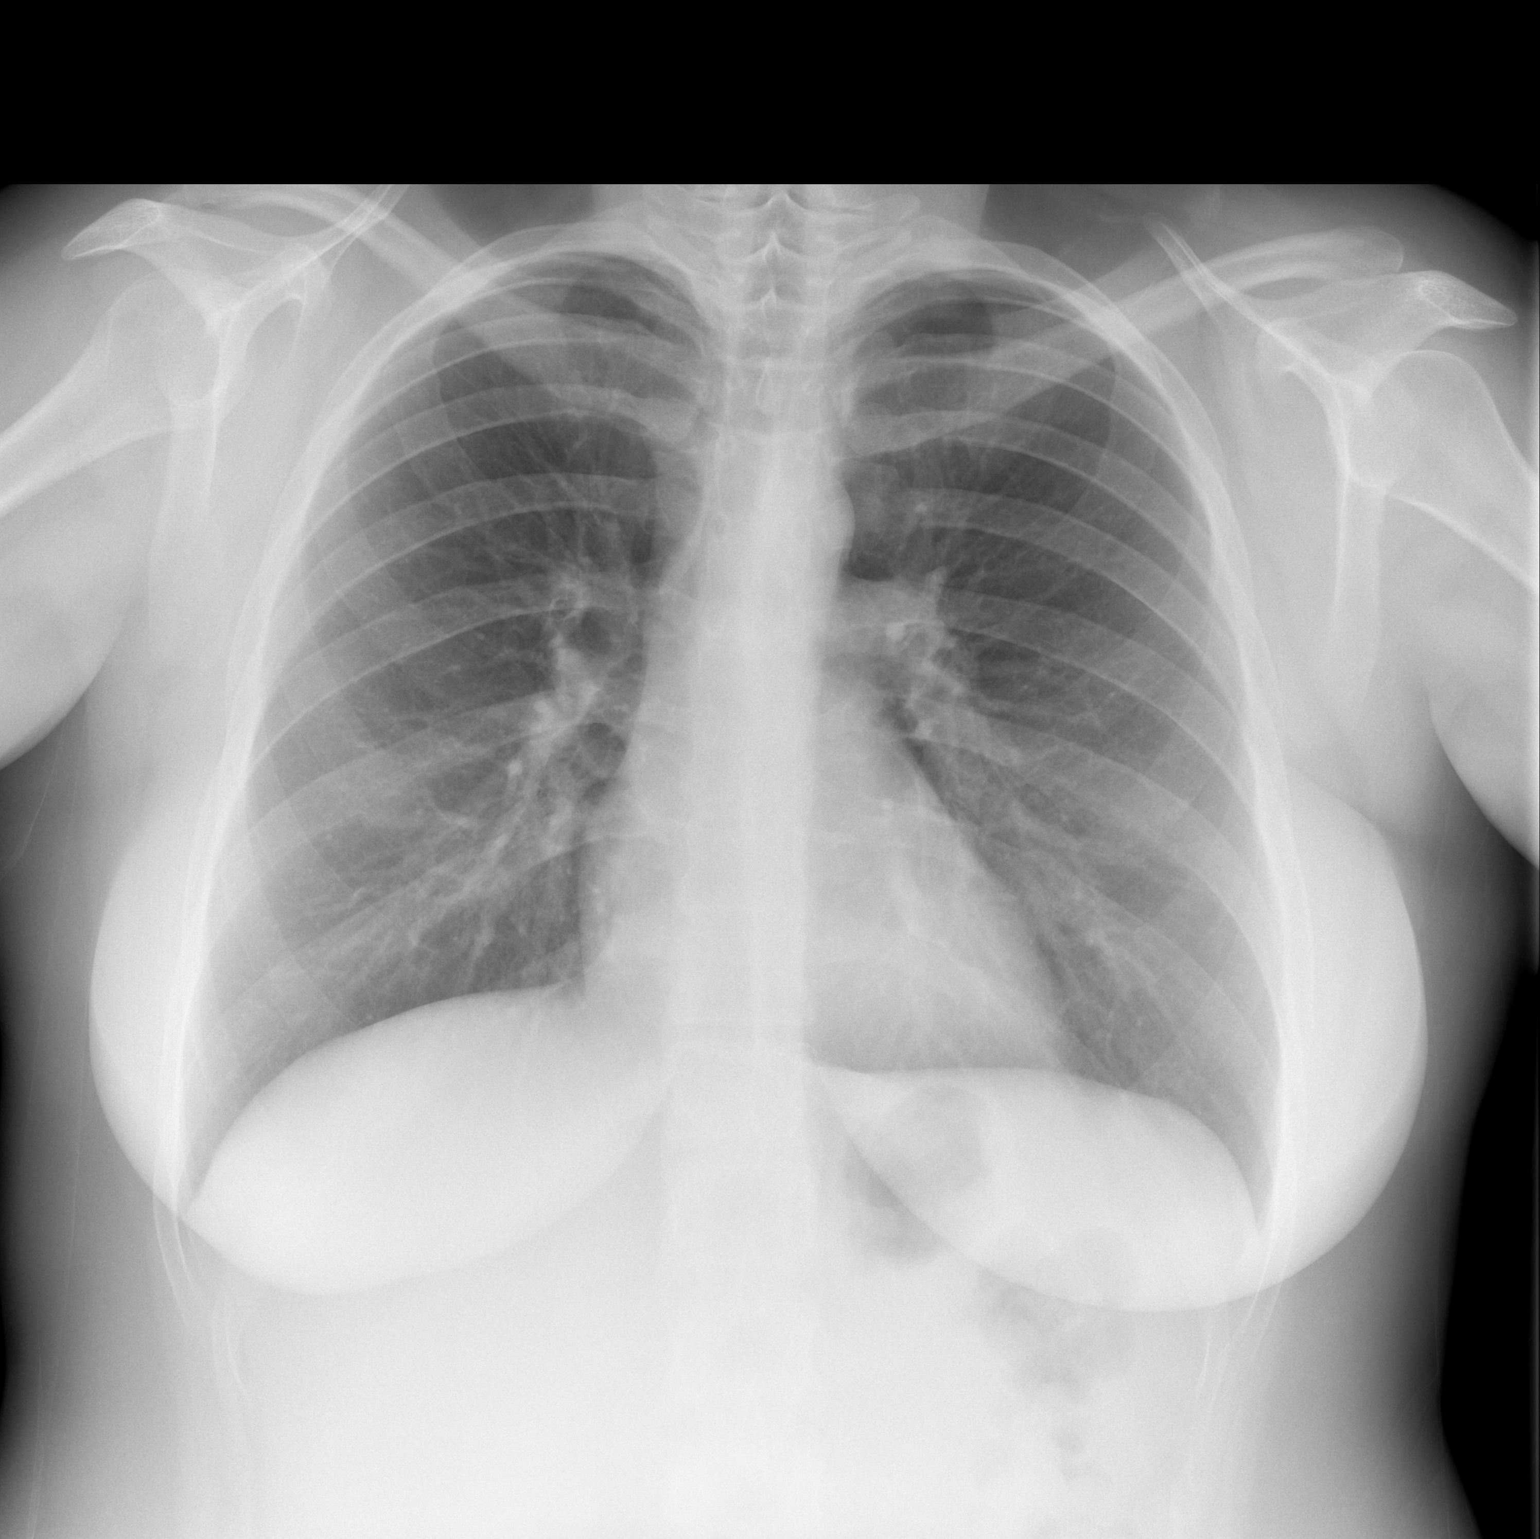

[w chest lat]
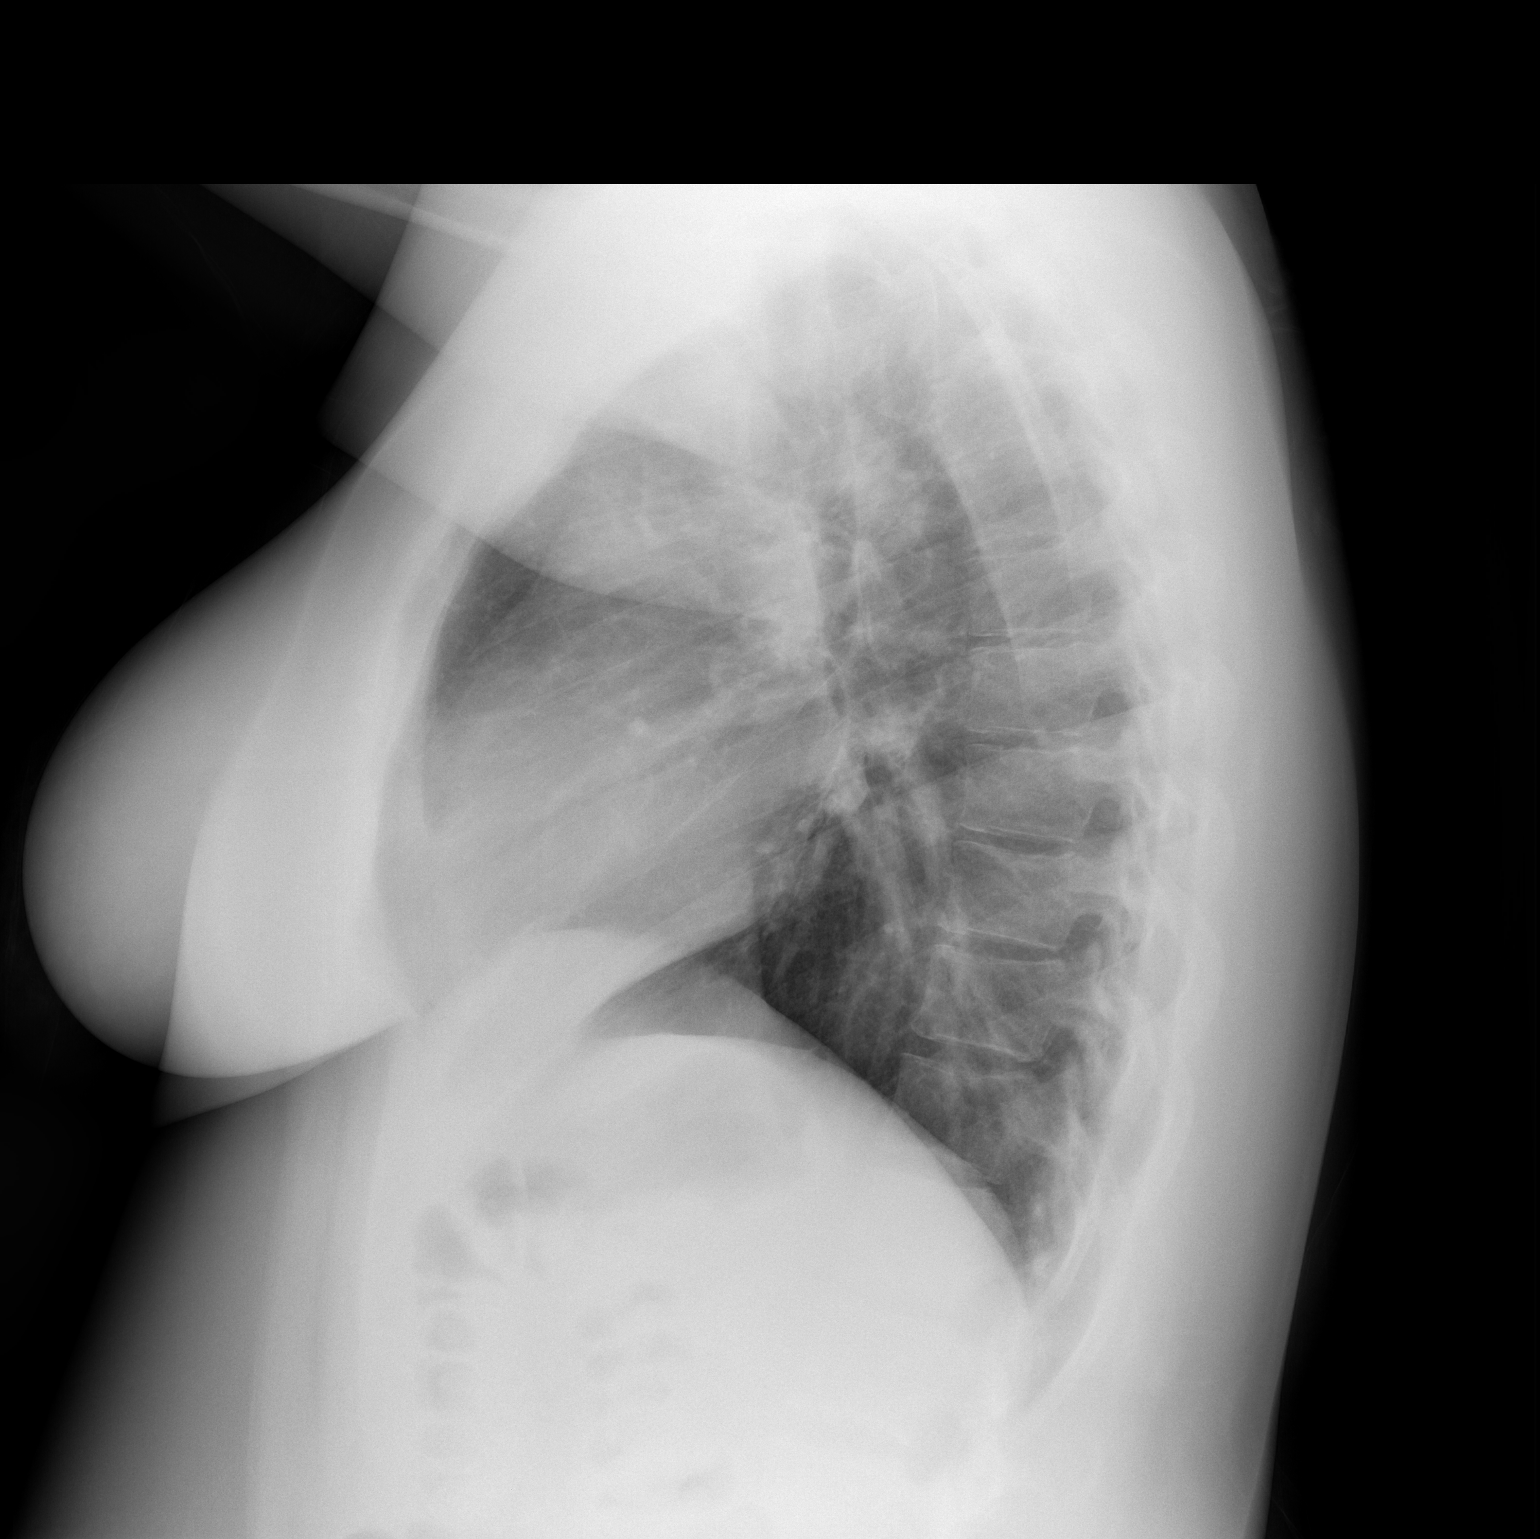

[2 of 2 positions shown; findings below may reference images not displayed]

FINDINGS: The heart size and mediastinal contours are within normal limits.
Both lungs are clear. The visualized skeletal structures are
unremarkable.
IMPRESSION: No active cardiopulmonary disease.

## 2020-03-09 IMAGING — CR DG CHEST 2V
2 series · 2 of 2 positions shown · non-contrast
Comparison: 11/28/2018

CLINICAL DATA: Cough and shortness of breath. Awoke with chest
pain.

EXAM:
CHEST - 2 VIEW

[w chest pa]
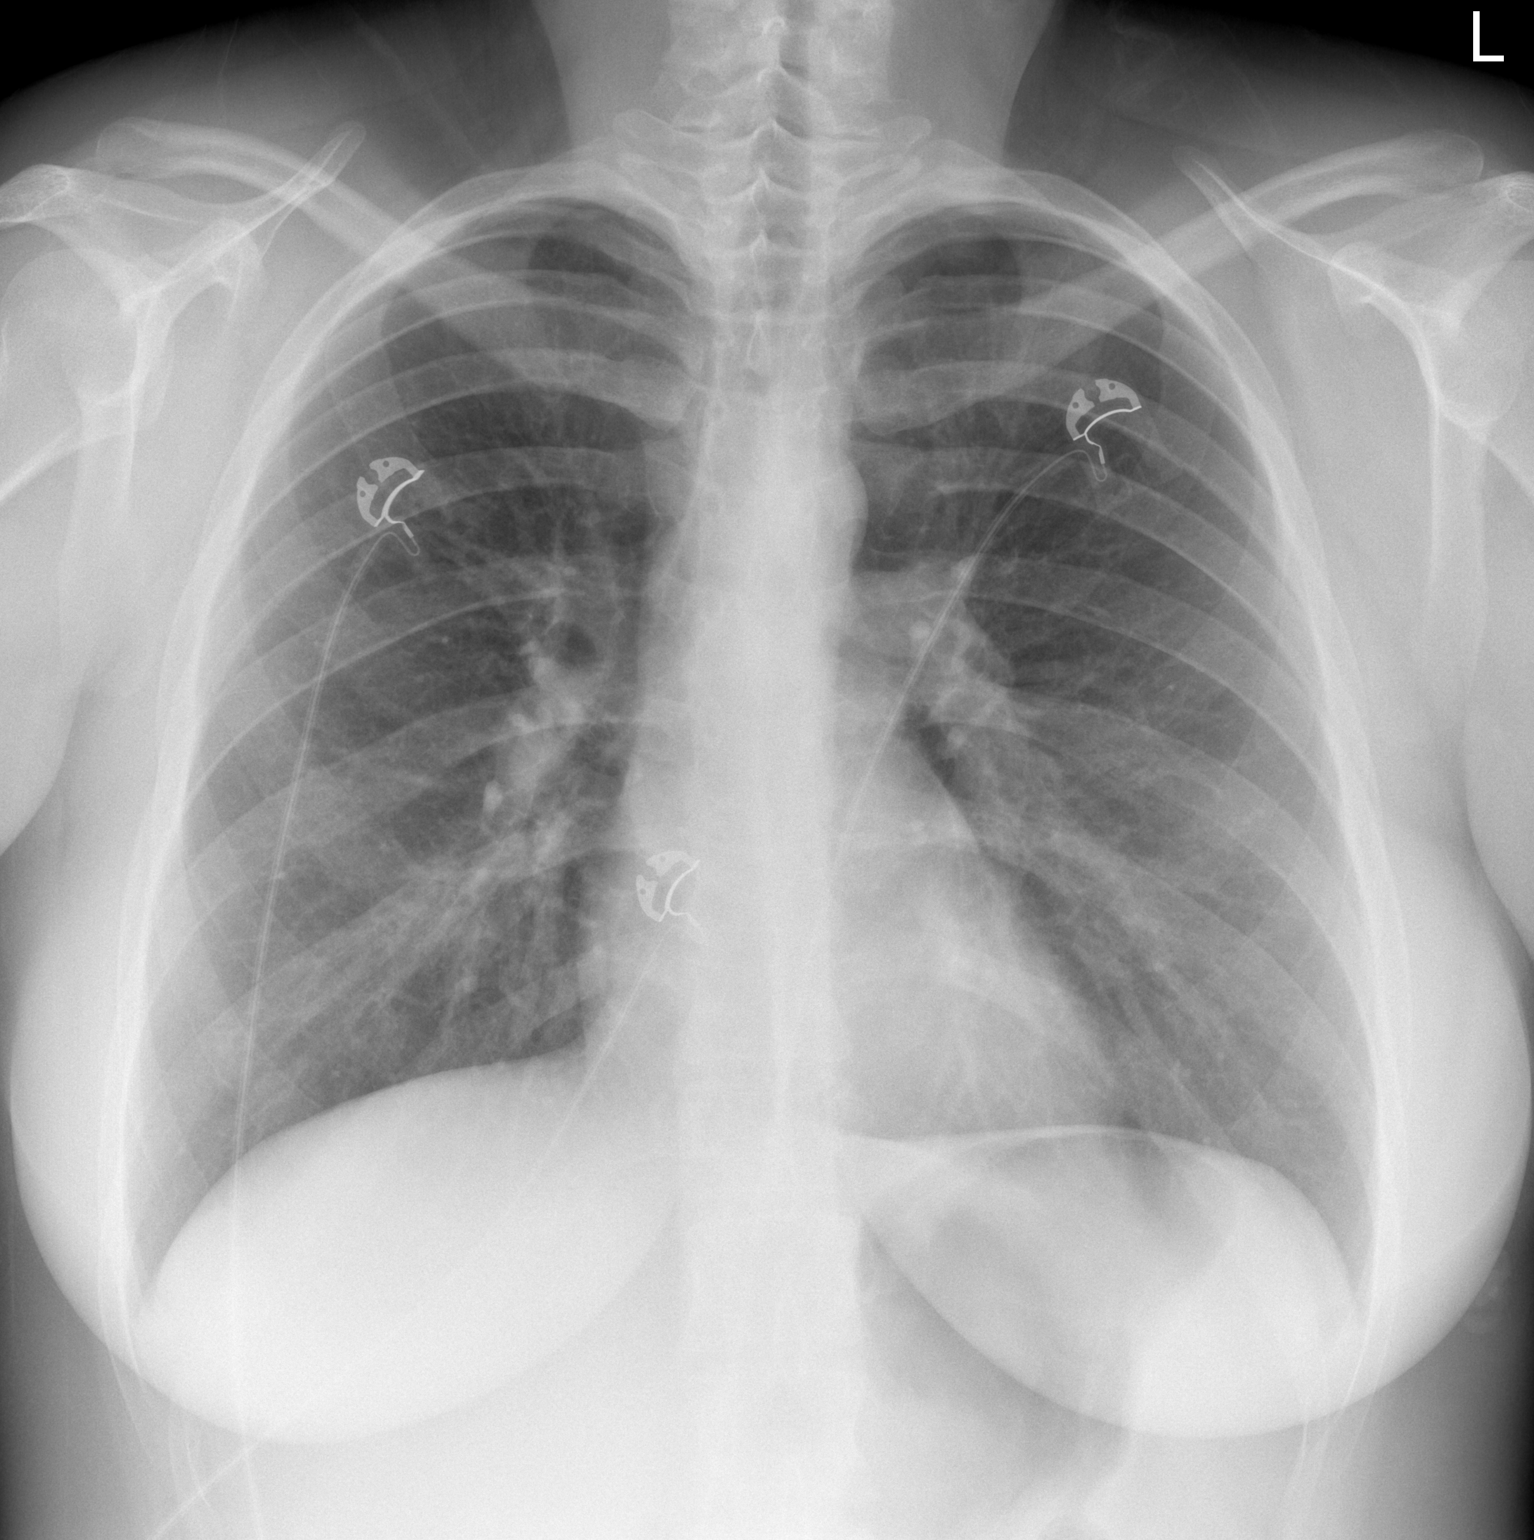

[w chest lat]
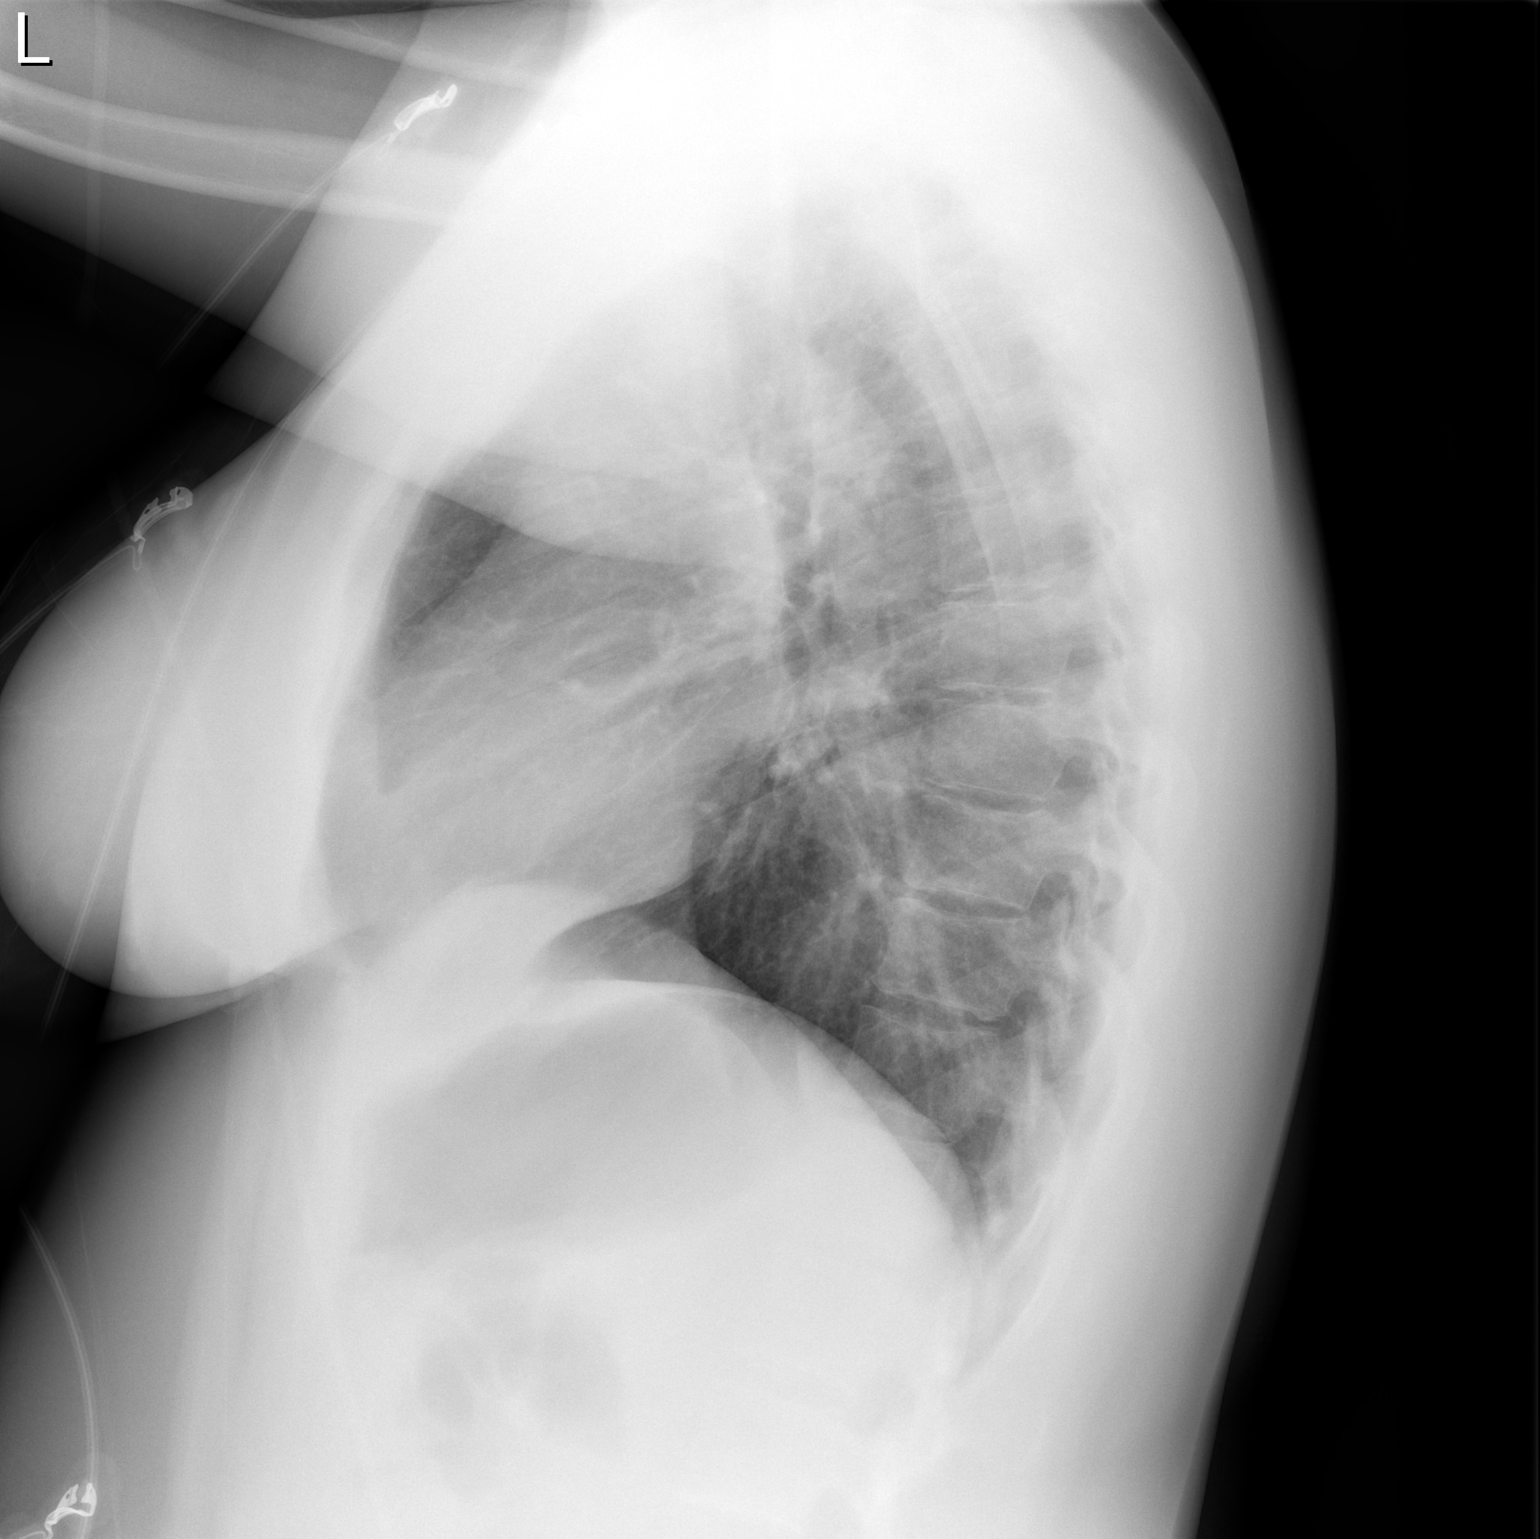

[2 of 2 positions shown; findings below may reference images not displayed]

FINDINGS: The cardiomediastinal contours are normal. The lungs are clear.
Pulmonary vasculature is normal. No consolidation, pleural effusion,
or pneumothorax. No acute osseous abnormalities are seen.
IMPRESSION: Unremarkable radiographs of the chest.

## 2020-03-28 DIAGNOSIS — J301 Allergic rhinitis due to pollen: Secondary | ICD-10-CM | POA: Insufficient documentation

## 2020-06-13 DIAGNOSIS — E6609 Other obesity due to excess calories: Secondary | ICD-10-CM | POA: Diagnosis not present

## 2020-06-13 DIAGNOSIS — Z6831 Body mass index (BMI) 31.0-31.9, adult: Secondary | ICD-10-CM | POA: Diagnosis not present

## 2020-06-13 DIAGNOSIS — Z713 Dietary counseling and surveillance: Secondary | ICD-10-CM | POA: Diagnosis not present

## 2020-06-19 DIAGNOSIS — F341 Dysthymic disorder: Secondary | ICD-10-CM | POA: Diagnosis not present

## 2020-06-19 DIAGNOSIS — Z79899 Other long term (current) drug therapy: Secondary | ICD-10-CM | POA: Diagnosis not present

## 2020-06-19 DIAGNOSIS — Z20822 Contact with and (suspected) exposure to covid-19: Secondary | ICD-10-CM | POA: Diagnosis not present

## 2020-06-19 DIAGNOSIS — R5383 Other fatigue: Secondary | ICD-10-CM | POA: Diagnosis not present

## 2020-06-19 DIAGNOSIS — Z3200 Encounter for pregnancy test, result unknown: Secondary | ICD-10-CM | POA: Diagnosis not present

## 2020-06-19 DIAGNOSIS — Z13220 Encounter for screening for lipoid disorders: Secondary | ICD-10-CM | POA: Diagnosis not present

## 2020-06-19 DIAGNOSIS — Z1331 Encounter for screening for depression: Secondary | ICD-10-CM | POA: Diagnosis not present

## 2020-06-19 DIAGNOSIS — F411 Generalized anxiety disorder: Secondary | ICD-10-CM | POA: Diagnosis not present

## 2020-06-19 DIAGNOSIS — E559 Vitamin D deficiency, unspecified: Secondary | ICD-10-CM | POA: Diagnosis not present

## 2020-06-21 DIAGNOSIS — F418 Other specified anxiety disorders: Secondary | ICD-10-CM | POA: Diagnosis not present

## 2020-07-13 DIAGNOSIS — R634 Abnormal weight loss: Secondary | ICD-10-CM | POA: Diagnosis not present

## 2020-07-13 DIAGNOSIS — R7401 Elevation of levels of liver transaminase levels: Secondary | ICD-10-CM | POA: Diagnosis not present

## 2020-07-13 DIAGNOSIS — Z6828 Body mass index (BMI) 28.0-28.9, adult: Secondary | ICD-10-CM | POA: Diagnosis not present

## 2020-07-13 DIAGNOSIS — E663 Overweight: Secondary | ICD-10-CM | POA: Diagnosis not present

## 2020-07-30 DIAGNOSIS — F411 Generalized anxiety disorder: Secondary | ICD-10-CM | POA: Diagnosis not present

## 2020-07-30 DIAGNOSIS — F341 Dysthymic disorder: Secondary | ICD-10-CM | POA: Diagnosis not present

## 2020-07-30 DIAGNOSIS — F3181 Bipolar II disorder: Secondary | ICD-10-CM | POA: Diagnosis not present

## 2020-07-30 DIAGNOSIS — Z3200 Encounter for pregnancy test, result unknown: Secondary | ICD-10-CM | POA: Diagnosis not present

## 2020-08-02 DIAGNOSIS — R102 Pelvic and perineal pain: Secondary | ICD-10-CM | POA: Diagnosis not present

## 2020-08-02 DIAGNOSIS — B373 Candidiasis of vulva and vagina: Secondary | ICD-10-CM | POA: Diagnosis not present

## 2020-08-02 DIAGNOSIS — N926 Irregular menstruation, unspecified: Secondary | ICD-10-CM | POA: Diagnosis not present

## 2020-08-09 DIAGNOSIS — N898 Other specified noninflammatory disorders of vagina: Secondary | ICD-10-CM | POA: Diagnosis not present

## 2020-08-09 DIAGNOSIS — Z113 Encounter for screening for infections with a predominantly sexual mode of transmission: Secondary | ICD-10-CM | POA: Diagnosis not present

## 2020-09-01 DIAGNOSIS — F3181 Bipolar II disorder: Secondary | ICD-10-CM | POA: Diagnosis not present

## 2020-09-01 DIAGNOSIS — Z3200 Encounter for pregnancy test, result unknown: Secondary | ICD-10-CM | POA: Diagnosis not present

## 2020-09-01 DIAGNOSIS — F411 Generalized anxiety disorder: Secondary | ICD-10-CM | POA: Diagnosis not present

## 2020-09-01 DIAGNOSIS — F341 Dysthymic disorder: Secondary | ICD-10-CM | POA: Diagnosis not present

## 2020-09-05 DIAGNOSIS — R059 Cough, unspecified: Secondary | ICD-10-CM | POA: Diagnosis not present

## 2020-09-05 DIAGNOSIS — R5383 Other fatigue: Secondary | ICD-10-CM | POA: Diagnosis not present

## 2020-09-05 DIAGNOSIS — Z03818 Encounter for observation for suspected exposure to other biological agents ruled out: Secondary | ICD-10-CM | POA: Diagnosis not present

## 2020-09-06 DIAGNOSIS — J019 Acute sinusitis, unspecified: Secondary | ICD-10-CM | POA: Diagnosis not present

## 2020-09-13 DIAGNOSIS — R102 Pelvic and perineal pain: Secondary | ICD-10-CM | POA: Diagnosis not present

## 2020-09-13 DIAGNOSIS — M9902 Segmental and somatic dysfunction of thoracic region: Secondary | ICD-10-CM | POA: Diagnosis not present

## 2020-09-13 DIAGNOSIS — N926 Irregular menstruation, unspecified: Secondary | ICD-10-CM | POA: Diagnosis not present

## 2020-09-13 DIAGNOSIS — M47814 Spondylosis without myelopathy or radiculopathy, thoracic region: Secondary | ICD-10-CM | POA: Diagnosis not present

## 2020-09-13 DIAGNOSIS — M4723 Other spondylosis with radiculopathy, cervicothoracic region: Secondary | ICD-10-CM | POA: Diagnosis not present

## 2020-09-13 DIAGNOSIS — M9901 Segmental and somatic dysfunction of cervical region: Secondary | ICD-10-CM | POA: Diagnosis not present

## 2020-09-14 DIAGNOSIS — M4723 Other spondylosis with radiculopathy, cervicothoracic region: Secondary | ICD-10-CM | POA: Diagnosis not present

## 2020-09-14 DIAGNOSIS — M9902 Segmental and somatic dysfunction of thoracic region: Secondary | ICD-10-CM | POA: Diagnosis not present

## 2020-09-14 DIAGNOSIS — M47814 Spondylosis without myelopathy or radiculopathy, thoracic region: Secondary | ICD-10-CM | POA: Diagnosis not present

## 2020-09-14 DIAGNOSIS — M9901 Segmental and somatic dysfunction of cervical region: Secondary | ICD-10-CM | POA: Diagnosis not present

## 2020-09-15 DIAGNOSIS — M4723 Other spondylosis with radiculopathy, cervicothoracic region: Secondary | ICD-10-CM | POA: Diagnosis not present

## 2020-09-15 DIAGNOSIS — M9901 Segmental and somatic dysfunction of cervical region: Secondary | ICD-10-CM | POA: Diagnosis not present

## 2020-09-15 DIAGNOSIS — M47814 Spondylosis without myelopathy or radiculopathy, thoracic region: Secondary | ICD-10-CM | POA: Diagnosis not present

## 2020-09-15 DIAGNOSIS — M9902 Segmental and somatic dysfunction of thoracic region: Secondary | ICD-10-CM | POA: Diagnosis not present

## 2020-09-19 DIAGNOSIS — M47814 Spondylosis without myelopathy or radiculopathy, thoracic region: Secondary | ICD-10-CM | POA: Diagnosis not present

## 2020-09-19 DIAGNOSIS — M4723 Other spondylosis with radiculopathy, cervicothoracic region: Secondary | ICD-10-CM | POA: Diagnosis not present

## 2020-09-19 DIAGNOSIS — M9901 Segmental and somatic dysfunction of cervical region: Secondary | ICD-10-CM | POA: Diagnosis not present

## 2020-09-19 DIAGNOSIS — M9902 Segmental and somatic dysfunction of thoracic region: Secondary | ICD-10-CM | POA: Diagnosis not present

## 2020-09-20 DIAGNOSIS — M9901 Segmental and somatic dysfunction of cervical region: Secondary | ICD-10-CM | POA: Diagnosis not present

## 2020-09-20 DIAGNOSIS — M4723 Other spondylosis with radiculopathy, cervicothoracic region: Secondary | ICD-10-CM | POA: Diagnosis not present

## 2020-09-20 DIAGNOSIS — M47814 Spondylosis without myelopathy or radiculopathy, thoracic region: Secondary | ICD-10-CM | POA: Diagnosis not present

## 2020-09-20 DIAGNOSIS — M9902 Segmental and somatic dysfunction of thoracic region: Secondary | ICD-10-CM | POA: Diagnosis not present

## 2020-09-21 DIAGNOSIS — M4723 Other spondylosis with radiculopathy, cervicothoracic region: Secondary | ICD-10-CM | POA: Diagnosis not present

## 2020-09-21 DIAGNOSIS — M9902 Segmental and somatic dysfunction of thoracic region: Secondary | ICD-10-CM | POA: Diagnosis not present

## 2020-09-21 DIAGNOSIS — M47814 Spondylosis without myelopathy or radiculopathy, thoracic region: Secondary | ICD-10-CM | POA: Diagnosis not present

## 2020-09-21 DIAGNOSIS — M9901 Segmental and somatic dysfunction of cervical region: Secondary | ICD-10-CM | POA: Diagnosis not present

## 2020-09-22 DIAGNOSIS — M4723 Other spondylosis with radiculopathy, cervicothoracic region: Secondary | ICD-10-CM | POA: Diagnosis not present

## 2020-09-22 DIAGNOSIS — M47814 Spondylosis without myelopathy or radiculopathy, thoracic region: Secondary | ICD-10-CM | POA: Diagnosis not present

## 2020-09-22 DIAGNOSIS — M9902 Segmental and somatic dysfunction of thoracic region: Secondary | ICD-10-CM | POA: Diagnosis not present

## 2020-09-22 DIAGNOSIS — M9901 Segmental and somatic dysfunction of cervical region: Secondary | ICD-10-CM | POA: Diagnosis not present

## 2020-09-26 DIAGNOSIS — M9901 Segmental and somatic dysfunction of cervical region: Secondary | ICD-10-CM | POA: Diagnosis not present

## 2020-09-26 DIAGNOSIS — M47814 Spondylosis without myelopathy or radiculopathy, thoracic region: Secondary | ICD-10-CM | POA: Diagnosis not present

## 2020-09-26 DIAGNOSIS — M9902 Segmental and somatic dysfunction of thoracic region: Secondary | ICD-10-CM | POA: Diagnosis not present

## 2020-09-26 DIAGNOSIS — M4723 Other spondylosis with radiculopathy, cervicothoracic region: Secondary | ICD-10-CM | POA: Diagnosis not present

## 2020-09-27 DIAGNOSIS — M9902 Segmental and somatic dysfunction of thoracic region: Secondary | ICD-10-CM | POA: Diagnosis not present

## 2020-09-27 DIAGNOSIS — M4723 Other spondylosis with radiculopathy, cervicothoracic region: Secondary | ICD-10-CM | POA: Diagnosis not present

## 2020-09-27 DIAGNOSIS — M47814 Spondylosis without myelopathy or radiculopathy, thoracic region: Secondary | ICD-10-CM | POA: Diagnosis not present

## 2020-09-27 DIAGNOSIS — M9901 Segmental and somatic dysfunction of cervical region: Secondary | ICD-10-CM | POA: Diagnosis not present

## 2020-09-28 DIAGNOSIS — M4723 Other spondylosis with radiculopathy, cervicothoracic region: Secondary | ICD-10-CM | POA: Diagnosis not present

## 2020-09-28 DIAGNOSIS — M9901 Segmental and somatic dysfunction of cervical region: Secondary | ICD-10-CM | POA: Diagnosis not present

## 2020-09-28 DIAGNOSIS — M9902 Segmental and somatic dysfunction of thoracic region: Secondary | ICD-10-CM | POA: Diagnosis not present

## 2020-09-28 DIAGNOSIS — M47814 Spondylosis without myelopathy or radiculopathy, thoracic region: Secondary | ICD-10-CM | POA: Diagnosis not present

## 2020-09-29 DIAGNOSIS — M9902 Segmental and somatic dysfunction of thoracic region: Secondary | ICD-10-CM | POA: Diagnosis not present

## 2020-09-29 DIAGNOSIS — M47814 Spondylosis without myelopathy or radiculopathy, thoracic region: Secondary | ICD-10-CM | POA: Diagnosis not present

## 2020-09-29 DIAGNOSIS — M4723 Other spondylosis with radiculopathy, cervicothoracic region: Secondary | ICD-10-CM | POA: Diagnosis not present

## 2020-09-29 DIAGNOSIS — M9901 Segmental and somatic dysfunction of cervical region: Secondary | ICD-10-CM | POA: Diagnosis not present

## 2020-09-30 DIAGNOSIS — F3181 Bipolar II disorder: Secondary | ICD-10-CM | POA: Diagnosis not present

## 2020-09-30 DIAGNOSIS — F341 Dysthymic disorder: Secondary | ICD-10-CM | POA: Diagnosis not present

## 2020-09-30 DIAGNOSIS — Z3200 Encounter for pregnancy test, result unknown: Secondary | ICD-10-CM | POA: Diagnosis not present

## 2020-09-30 DIAGNOSIS — F411 Generalized anxiety disorder: Secondary | ICD-10-CM | POA: Diagnosis not present

## 2020-10-04 DIAGNOSIS — M47814 Spondylosis without myelopathy or radiculopathy, thoracic region: Secondary | ICD-10-CM | POA: Diagnosis not present

## 2020-10-04 DIAGNOSIS — M4723 Other spondylosis with radiculopathy, cervicothoracic region: Secondary | ICD-10-CM | POA: Diagnosis not present

## 2020-10-04 DIAGNOSIS — M9902 Segmental and somatic dysfunction of thoracic region: Secondary | ICD-10-CM | POA: Diagnosis not present

## 2020-10-04 DIAGNOSIS — M9901 Segmental and somatic dysfunction of cervical region: Secondary | ICD-10-CM | POA: Diagnosis not present

## 2020-10-05 DIAGNOSIS — M9901 Segmental and somatic dysfunction of cervical region: Secondary | ICD-10-CM | POA: Diagnosis not present

## 2020-10-05 DIAGNOSIS — M47814 Spondylosis without myelopathy or radiculopathy, thoracic region: Secondary | ICD-10-CM | POA: Diagnosis not present

## 2020-10-05 DIAGNOSIS — M9902 Segmental and somatic dysfunction of thoracic region: Secondary | ICD-10-CM | POA: Diagnosis not present

## 2020-10-05 DIAGNOSIS — M4723 Other spondylosis with radiculopathy, cervicothoracic region: Secondary | ICD-10-CM | POA: Diagnosis not present

## 2020-10-10 DIAGNOSIS — M4723 Other spondylosis with radiculopathy, cervicothoracic region: Secondary | ICD-10-CM | POA: Diagnosis not present

## 2020-10-10 DIAGNOSIS — M9901 Segmental and somatic dysfunction of cervical region: Secondary | ICD-10-CM | POA: Diagnosis not present

## 2020-10-10 DIAGNOSIS — M9902 Segmental and somatic dysfunction of thoracic region: Secondary | ICD-10-CM | POA: Diagnosis not present

## 2020-10-10 DIAGNOSIS — M47814 Spondylosis without myelopathy or radiculopathy, thoracic region: Secondary | ICD-10-CM | POA: Diagnosis not present

## 2020-10-12 DIAGNOSIS — M9902 Segmental and somatic dysfunction of thoracic region: Secondary | ICD-10-CM | POA: Diagnosis not present

## 2020-10-12 DIAGNOSIS — M9901 Segmental and somatic dysfunction of cervical region: Secondary | ICD-10-CM | POA: Diagnosis not present

## 2020-10-12 DIAGNOSIS — M47814 Spondylosis without myelopathy or radiculopathy, thoracic region: Secondary | ICD-10-CM | POA: Diagnosis not present

## 2020-10-12 DIAGNOSIS — M4723 Other spondylosis with radiculopathy, cervicothoracic region: Secondary | ICD-10-CM | POA: Diagnosis not present

## 2020-10-17 DIAGNOSIS — M9901 Segmental and somatic dysfunction of cervical region: Secondary | ICD-10-CM | POA: Diagnosis not present

## 2020-10-17 DIAGNOSIS — M9902 Segmental and somatic dysfunction of thoracic region: Secondary | ICD-10-CM | POA: Diagnosis not present

## 2020-10-17 DIAGNOSIS — M47814 Spondylosis without myelopathy or radiculopathy, thoracic region: Secondary | ICD-10-CM | POA: Diagnosis not present

## 2020-10-17 DIAGNOSIS — M4723 Other spondylosis with radiculopathy, cervicothoracic region: Secondary | ICD-10-CM | POA: Diagnosis not present

## 2020-10-19 DIAGNOSIS — M47814 Spondylosis without myelopathy or radiculopathy, thoracic region: Secondary | ICD-10-CM | POA: Diagnosis not present

## 2020-10-19 DIAGNOSIS — M9901 Segmental and somatic dysfunction of cervical region: Secondary | ICD-10-CM | POA: Diagnosis not present

## 2020-10-19 DIAGNOSIS — M9902 Segmental and somatic dysfunction of thoracic region: Secondary | ICD-10-CM | POA: Diagnosis not present

## 2020-10-19 DIAGNOSIS — M4723 Other spondylosis with radiculopathy, cervicothoracic region: Secondary | ICD-10-CM | POA: Diagnosis not present

## 2020-10-24 DIAGNOSIS — M4723 Other spondylosis with radiculopathy, cervicothoracic region: Secondary | ICD-10-CM | POA: Diagnosis not present

## 2020-10-24 DIAGNOSIS — M9902 Segmental and somatic dysfunction of thoracic region: Secondary | ICD-10-CM | POA: Diagnosis not present

## 2020-10-24 DIAGNOSIS — M9901 Segmental and somatic dysfunction of cervical region: Secondary | ICD-10-CM | POA: Diagnosis not present

## 2020-10-24 DIAGNOSIS — M47814 Spondylosis without myelopathy or radiculopathy, thoracic region: Secondary | ICD-10-CM | POA: Diagnosis not present

## 2020-10-26 DIAGNOSIS — J209 Acute bronchitis, unspecified: Secondary | ICD-10-CM | POA: Diagnosis not present

## 2020-10-26 DIAGNOSIS — J45909 Unspecified asthma, uncomplicated: Secondary | ICD-10-CM | POA: Diagnosis not present

## 2020-10-31 DIAGNOSIS — M47814 Spondylosis without myelopathy or radiculopathy, thoracic region: Secondary | ICD-10-CM | POA: Diagnosis not present

## 2020-10-31 DIAGNOSIS — M9901 Segmental and somatic dysfunction of cervical region: Secondary | ICD-10-CM | POA: Diagnosis not present

## 2020-10-31 DIAGNOSIS — M4723 Other spondylosis with radiculopathy, cervicothoracic region: Secondary | ICD-10-CM | POA: Diagnosis not present

## 2020-10-31 DIAGNOSIS — M9902 Segmental and somatic dysfunction of thoracic region: Secondary | ICD-10-CM | POA: Diagnosis not present

## 2020-11-03 DIAGNOSIS — M47814 Spondylosis without myelopathy or radiculopathy, thoracic region: Secondary | ICD-10-CM | POA: Diagnosis not present

## 2020-11-03 DIAGNOSIS — M4723 Other spondylosis with radiculopathy, cervicothoracic region: Secondary | ICD-10-CM | POA: Diagnosis not present

## 2020-11-03 DIAGNOSIS — M9901 Segmental and somatic dysfunction of cervical region: Secondary | ICD-10-CM | POA: Diagnosis not present

## 2020-11-03 DIAGNOSIS — M9902 Segmental and somatic dysfunction of thoracic region: Secondary | ICD-10-CM | POA: Diagnosis not present

## 2020-11-09 DIAGNOSIS — M9901 Segmental and somatic dysfunction of cervical region: Secondary | ICD-10-CM | POA: Diagnosis not present

## 2020-11-09 DIAGNOSIS — M47814 Spondylosis without myelopathy or radiculopathy, thoracic region: Secondary | ICD-10-CM | POA: Diagnosis not present

## 2020-11-09 DIAGNOSIS — M9902 Segmental and somatic dysfunction of thoracic region: Secondary | ICD-10-CM | POA: Diagnosis not present

## 2020-11-09 DIAGNOSIS — M4723 Other spondylosis with radiculopathy, cervicothoracic region: Secondary | ICD-10-CM | POA: Diagnosis not present

## 2020-12-07 DIAGNOSIS — M4723 Other spondylosis with radiculopathy, cervicothoracic region: Secondary | ICD-10-CM | POA: Diagnosis not present

## 2020-12-07 DIAGNOSIS — M47814 Spondylosis without myelopathy or radiculopathy, thoracic region: Secondary | ICD-10-CM | POA: Diagnosis not present

## 2020-12-07 DIAGNOSIS — M9901 Segmental and somatic dysfunction of cervical region: Secondary | ICD-10-CM | POA: Diagnosis not present

## 2020-12-07 DIAGNOSIS — M9902 Segmental and somatic dysfunction of thoracic region: Secondary | ICD-10-CM | POA: Diagnosis not present

## 2020-12-12 DIAGNOSIS — J189 Pneumonia, unspecified organism: Secondary | ICD-10-CM | POA: Diagnosis not present

## 2020-12-27 DIAGNOSIS — M4723 Other spondylosis with radiculopathy, cervicothoracic region: Secondary | ICD-10-CM | POA: Diagnosis not present

## 2020-12-27 DIAGNOSIS — M9901 Segmental and somatic dysfunction of cervical region: Secondary | ICD-10-CM | POA: Diagnosis not present

## 2020-12-27 DIAGNOSIS — M47814 Spondylosis without myelopathy or radiculopathy, thoracic region: Secondary | ICD-10-CM | POA: Diagnosis not present

## 2020-12-27 DIAGNOSIS — N9089 Other specified noninflammatory disorders of vulva and perineum: Secondary | ICD-10-CM | POA: Diagnosis not present

## 2020-12-27 DIAGNOSIS — M9902 Segmental and somatic dysfunction of thoracic region: Secondary | ICD-10-CM | POA: Diagnosis not present

## 2020-12-27 DIAGNOSIS — N926 Irregular menstruation, unspecified: Secondary | ICD-10-CM | POA: Diagnosis not present

## 2020-12-28 DIAGNOSIS — U071 COVID-19: Secondary | ICD-10-CM | POA: Diagnosis not present

## 2020-12-29 ENCOUNTER — Institutional Professional Consult (permissible substitution): Payer: Self-pay | Admitting: Pulmonary Disease

## 2021-01-09 ENCOUNTER — Encounter: Payer: Self-pay | Admitting: Pulmonary Disease

## 2021-01-09 ENCOUNTER — Other Ambulatory Visit: Payer: Self-pay

## 2021-01-09 ENCOUNTER — Ambulatory Visit: Payer: Self-pay | Admitting: Pulmonary Disease

## 2021-01-09 VITALS — BP 118/72 | HR 94 | Temp 99.3°F | Ht 68.0 in | Wt 202.4 lb

## 2021-01-09 DIAGNOSIS — Z8619 Personal history of other infectious and parasitic diseases: Secondary | ICD-10-CM

## 2021-01-09 DIAGNOSIS — R0683 Snoring: Secondary | ICD-10-CM

## 2021-01-09 DIAGNOSIS — R059 Cough, unspecified: Secondary | ICD-10-CM

## 2021-01-09 MED ORDER — FLUTICASONE PROPIONATE 50 MCG/ACT NA SUSP: 1.0000 | Freq: Every day | NASAL | 2 refills | Status: DC

## 2021-01-09 MED ORDER — FAMOTIDINE 20 MG PO TABS: 20.0000 mg | ORAL_TABLET | Freq: Every day | ORAL | 0 refills | Status: DC

## 2021-01-09 MED ORDER — OMEPRAZOLE 40 MG PO CPDR: 40.0000 mg | DELAYED_RELEASE_CAPSULE | Freq: Every day | ORAL | 11 refills | Status: DC

## 2021-01-09 MED ORDER — ALBUTEROL SULFATE HFA 108 (90 BASE) MCG/ACT IN AERS: 2.0000 | INHALATION_SPRAY | Freq: Four times a day (QID) | RESPIRATORY_TRACT | 6 refills | Status: DC | PRN

## 2021-01-09 NOTE — Progress Notes (Signed)
Synopsis: Referred in January 2022 for Chronic Cough  Subjective:   PATIENT ID: Rose Gray GENDER: female DOB: 1996-02-23, MRN: [id number]   HPI  Chief Complaint  Patient presents with  . Consult    Persistent cough   Rose Gray is a 25 year old woman, never smoker with history of childhood asthma who is referred to pulmonary clinic for chronic cough.   She reports having cough since 2018. The cough is dry and can be aggravated by seasonal allergies due to ragweed or strong scents such as a fajita dish at a [employer]. She denies wheezing or chest tightness. She does report heartburn and has previously been treated with PPI therapy by GI. She had an EGD in 2020 which was normal. She does sleep on a wedge pillow. She denies dysphagia.   She denies sinus congestion or drainage unless she is acutely sick.   She reports snoring and apneic events when she sleeps per her boyfriend. She does not feel rested in the morning when she wakes.   She reports frequent viral and bacterial infections over recent years on a frequent basis.  Past Medical History:  Diagnosis Date  . Anxiety   . Asthma    childhood  . GERD (gastroesophageal reflux disease)   . Hypertension      Family History  Problem Relation Age of Onset  . Diabetes Mellitus II Maternal Grandmother   . Heart attack Father   . Ulcerative colitis Father   . Diverticulitis Mother   . Colon polyps Paternal Grandmother   . Colon polyps Sister   . Colon cancer Neg Hx   . Esophageal cancer Neg Hx   . Rectal cancer Neg Hx   . Stomach cancer Neg Hx      Social History   Socioeconomic History  . Marital status: Single    Spouse name: Not on file  . Number of children: Not on file  . Years of education: Not on file  . Highest education level: Not on file  Occupational History  . Not on file  Tobacco Use  . Smoking status: Passive Smoke Exposure - Never Smoker  . Smokeless tobacco: Never Used   Vaping Use  . Vaping Use: Never used  Substance and Sexual Activity  . Alcohol use: No  . Drug use: No  . Sexual activity: Not on file  Other Topics Concern  . Not on file  Social History Narrative  . Not on file   Social Determinants of Health   Financial Resource Strain: Not on file  Food Insecurity: Not on file  Transportation Needs: Not on file  Physical Activity: Not on file  Stress: Not on file  Social Connections: Not on file  Intimate Partner Violence: Not on file     Allergies  Allergen Reactions  . Vancomycin Itching and Other (See Comments)    Became flushed in the face and itching and redness on scalp     Outpatient Medications Prior to Visit  Medication Sig Dispense Refill  . Omega-3 Fatty Acids (FISH OIL) 1000 MG CAPS Take by mouth.    . Oxcarbazepine (TRILEPTAL) 300 MG tablet Take by mouth.    . sertraline (ZOLOFT) 25 MG tablet Take by mouth.    . colestipol (COLESTID) 1 g tablet Take 2 tablets (2 g total) by mouth 2 (two) times daily. (Patient not taking: Reported on 01/09/2021) 120 tablet 2  . Norgestimate-Ethinyl Estradiol Triphasic 0.18/0.215/0.25 MG-25 MCG tab Take 1 tablet by  mouth daily. (Patient not taking: Reported on 01/09/2021)    . propranolol ER (INDERAL LA) 60 MG 24 hr capsule Take 60 mg by mouth daily. (Patient not taking: Reported on 01/09/2021)    . omeprazole (PRILOSEC) 40 MG capsule Take 1 capsule (40 mg total) by mouth daily. (Patient not taking: No sig reported) 30 capsule 11   No facility-administered medications prior to visit.    Review of Systems  Constitutional: Negative for chills, fever, malaise/fatigue and weight loss.  HENT: Negative for congestion, sinus pain and sore throat.   Eyes: Negative.   Respiratory: Positive for cough. Negative for hemoptysis, sputum production, shortness of breath and wheezing.   Cardiovascular: Negative for chest pain, palpitations, orthopnea, claudication and leg swelling.  Gastrointestinal: Positive  for heartburn. Negative for abdominal pain, nausea and vomiting.  Genitourinary: Negative.   Musculoskeletal: Negative for joint pain and myalgias.  Skin: Negative for rash.  Neurological: Negative for weakness.  Endo/Heme/Allergies: Negative.   Psychiatric/Behavioral: Negative.       Objective:   Vitals:   01/09/21 1624  BP: 118/72  Pulse: 94  Temp: 99.3 F (37.4 C)  TempSrc: Oral  SpO2: 98%  Weight: 202 lb 6.4 oz (91.8 kg)  Height: 5\' 8"  (1.727 m)     Physical Exam Constitutional:      General: She is not in acute distress.    Appearance: Normal appearance. She is not ill-appearing.  HENT:     Head: Normocephalic and atraumatic.  Eyes:     General: No scleral icterus.    Conjunctiva/sclera: Conjunctivae normal.     Pupils: Pupils are equal, round, and reactive to light.  Cardiovascular:     Rate and Rhythm: Normal rate and regular rhythm.     Pulses: Normal pulses.     Heart sounds: Normal heart sounds. No murmur heard.   Pulmonary:     Effort: Pulmonary effort is normal.     Breath sounds: Normal breath sounds. No wheezing, rhonchi or rales.  Abdominal:     General: Bowel sounds are normal.     Palpations: Abdomen is soft.  Musculoskeletal:     Right lower leg: No edema.     Left lower leg: No edema.  Lymphadenopathy:     Cervical: No cervical adenopathy.  Skin:    General: Skin is warm and dry.  Neurological:     General: No focal deficit present.     Mental Status: She is alert.  Psychiatric:        Mood and Affect: Mood normal.        Behavior: Behavior normal.        Thought Content: Thought content normal.        Judgment: Judgment normal.     CBC    Component Value Date/Time   WBC 9.8 02/02/2019 0801   RBC 3.75 (L) 02/02/2019 0801   HGB 11.8 (L) 02/02/2019 0801   HCT 36.8 02/02/2019 0801   PLT 358 02/02/2019 0801   MCV 98.1 02/02/2019 0801   MCH 31.5 02/02/2019 0801   MCHC 32.1 02/02/2019 0801   RDW 13.1 02/02/2019 0801    LYMPHSABS 0.9 02/02/2019 0801   MONOABS 1.2 (H) 02/02/2019 0801   EOSABS 0.0 02/02/2019 0801   BASOSABS 0.1 02/02/2019 0801   Labs reviewed from care everywhere  Chest imaging: CXR 02/01/2019 The cardiomediastinal contours are normal. The lungs are clear. Pulmonary vasculature is normal. No consolidation, pleural effusion, or pneumothorax. No acute osseous abnormalities are seen.  PFT:  No flowsheet data found.  Echo 02/02/2019 1. The left ventricle has normal systolic function with an ejection  fraction of 60-65%. The cavity size was normal. Left ventricular diastolic  parameters were normal.  2. The average left ventricular global longitudinal strain is -18.3 %.  3. The right ventricle has normal systolic function. The cavity was  normal. There is no increase in right ventricular wall thickness.  4. The mitral valve is normal in structure.  5. The tricuspid valve is normal in structure.  6. The aortic valve is tricuspid.  7. The pulmonic valve was normal in structure.  Assessment & Plan:   Cough - Plan: famotidine (PEPCID) 20 MG tablet, omeprazole (PRILOSEC) 40 MG capsule, albuterol (VENTOLIN HFA) 108 (90 Base) MCG/ACT inhaler  Snoring - Plan: Home sleep test  Frequent infections - Plan: IgG, IgA, IgM  Discussion: Rose Gray is a 25 year old woman, never smoker with history of childhood asthma who is referred to pulmonary clinic for chronic cough.   The etiology of her cough is unclear at this time. She has history of reflux disease and is not currently on treatment. We discussed treating her reflux with two medications this time along with elevating the head of her bed. She is to start omeprazole 40mg  daily and famotidine 20mg  at bedtime. She is to elevate the head of her bed with blocks about 4-6 inches.   We also discussed that the cough could be from habitually clearing her throat. We will try the above therapies first and then consider cough suppression  therapy.  We will provide her with an albuterol inhaler to use as needed if the cough is possibly secondary to reactive airways disease.   She is to also start flonase nasal spray in case sinus drainage is contributing to her cough.   She also reports history of frequent illnesses over the past couple of years. We will check immunoglobulin levels and consider sending her to immunology in the future for further work up if needed.   We will also consider a chest radiograph in the future if cough persists.   Follow up in 2 months   , MD [facility] Office: [phone]   See Amion for Pager Details    Current Outpatient Medications:  .  albuterol (VENTOLIN HFA) 108 (90 Base) MCG/ACT inhaler, Inhale 2 puffs into the lungs every 6 (six) hours as needed for wheezing or shortness of breath., Disp: 8 g, Rfl: 6 .  famotidine (PEPCID) 20 MG tablet, Take 1 tablet (20 mg total) by mouth at bedtime., Disp: 30 tablet, Rfl: 0 .  Omega-3 Fatty Acids (FISH OIL) 1000 MG CAPS, Take by mouth., Disp: , Rfl:  .  Oxcarbazepine (TRILEPTAL) 300 MG tablet, Take by mouth., Disp: , Rfl:  .  sertraline (ZOLOFT) 25 MG tablet, Take by mouth., Disp: , Rfl:  .  colestipol (COLESTID) 1 g tablet, Take 2 tablets (2 g total) by mouth 2 (two) times daily. (Patient not taking: Reported on 01/09/2021), Disp: 120 tablet, Rfl: 2 .  Norgestimate-Ethinyl Estradiol Triphasic 0.18/0.215/0.25 MG-25 MCG tab, Take 1 tablet by mouth daily. (Patient not taking: Reported on 01/09/2021), Disp: , Rfl:  .  omeprazole (PRILOSEC) 40 MG capsule, Take 1 capsule (40 mg total) by mouth daily., Disp: 30 capsule, Rfl: 11 .  propranolol ER (INDERAL LA) 60 MG 24 hr capsule, Take 60 mg by mouth daily. (Patient not taking: Reported on 01/09/2021), Disp: , Rfl:

## 2021-01-09 NOTE — Patient Instructions (Signed)
Start famotidine at bedtime  Start pantoprazole daily, 30 minutes before breakfast  Elevate the head of your bed about 4-6 inches with blocks  Use albuterol 1-2 puffs every 4-6 hours as needed for cough, shortness of breath, wheezing or chest tightness

## 2021-01-11 DIAGNOSIS — Z3200 Encounter for pregnancy test, result unknown: Secondary | ICD-10-CM | POA: Diagnosis not present

## 2021-01-11 DIAGNOSIS — F341 Dysthymic disorder: Secondary | ICD-10-CM | POA: Diagnosis not present

## 2021-01-11 DIAGNOSIS — F411 Generalized anxiety disorder: Secondary | ICD-10-CM | POA: Diagnosis not present

## 2021-01-11 DIAGNOSIS — F3181 Bipolar II disorder: Secondary | ICD-10-CM | POA: Diagnosis not present

## 2021-02-06 ENCOUNTER — Encounter: Payer: Self-pay | Admitting: Pulmonary Disease

## 2021-02-06 DIAGNOSIS — M9901 Segmental and somatic dysfunction of cervical region: Secondary | ICD-10-CM | POA: Diagnosis not present

## 2021-02-06 DIAGNOSIS — M9902 Segmental and somatic dysfunction of thoracic region: Secondary | ICD-10-CM | POA: Diagnosis not present

## 2021-02-06 DIAGNOSIS — M4723 Other spondylosis with radiculopathy, cervicothoracic region: Secondary | ICD-10-CM | POA: Diagnosis not present

## 2021-02-06 DIAGNOSIS — M47814 Spondylosis without myelopathy or radiculopathy, thoracic region: Secondary | ICD-10-CM | POA: Diagnosis not present

## 2021-02-13 DIAGNOSIS — M9901 Segmental and somatic dysfunction of cervical region: Secondary | ICD-10-CM | POA: Diagnosis not present

## 2021-02-13 DIAGNOSIS — M4723 Other spondylosis with radiculopathy, cervicothoracic region: Secondary | ICD-10-CM | POA: Diagnosis not present

## 2021-02-13 DIAGNOSIS — M47814 Spondylosis without myelopathy or radiculopathy, thoracic region: Secondary | ICD-10-CM | POA: Diagnosis not present

## 2021-02-13 DIAGNOSIS — M9902 Segmental and somatic dysfunction of thoracic region: Secondary | ICD-10-CM | POA: Diagnosis not present

## 2021-03-05 DIAGNOSIS — Z6828 Body mass index (BMI) 28.0-28.9, adult: Secondary | ICD-10-CM | POA: Diagnosis not present

## 2021-03-05 DIAGNOSIS — F341 Dysthymic disorder: Secondary | ICD-10-CM | POA: Diagnosis not present

## 2021-03-05 DIAGNOSIS — Z3200 Encounter for pregnancy test, result unknown: Secondary | ICD-10-CM | POA: Diagnosis not present

## 2021-03-05 DIAGNOSIS — F3181 Bipolar II disorder: Secondary | ICD-10-CM | POA: Diagnosis not present

## 2021-03-05 DIAGNOSIS — F411 Generalized anxiety disorder: Secondary | ICD-10-CM | POA: Diagnosis not present

## 2021-03-23 DIAGNOSIS — Z0000 Encounter for general adult medical examination without abnormal findings: Secondary | ICD-10-CM | POA: Diagnosis not present

## 2021-03-28 DIAGNOSIS — M4723 Other spondylosis with radiculopathy, cervicothoracic region: Secondary | ICD-10-CM | POA: Diagnosis not present

## 2021-03-28 DIAGNOSIS — M9902 Segmental and somatic dysfunction of thoracic region: Secondary | ICD-10-CM | POA: Diagnosis not present

## 2021-03-28 DIAGNOSIS — M9901 Segmental and somatic dysfunction of cervical region: Secondary | ICD-10-CM | POA: Diagnosis not present

## 2021-03-28 DIAGNOSIS — M47814 Spondylosis without myelopathy or radiculopathy, thoracic region: Secondary | ICD-10-CM | POA: Diagnosis not present

## 2021-04-05 DIAGNOSIS — Z6830 Body mass index (BMI) 30.0-30.9, adult: Secondary | ICD-10-CM | POA: Diagnosis not present

## 2021-04-05 DIAGNOSIS — F411 Generalized anxiety disorder: Secondary | ICD-10-CM | POA: Diagnosis not present

## 2021-04-05 DIAGNOSIS — F902 Attention-deficit hyperactivity disorder, combined type: Secondary | ICD-10-CM | POA: Diagnosis not present

## 2021-04-05 DIAGNOSIS — F341 Dysthymic disorder: Secondary | ICD-10-CM | POA: Diagnosis not present

## 2021-04-05 DIAGNOSIS — F3181 Bipolar II disorder: Secondary | ICD-10-CM | POA: Diagnosis not present

## 2021-05-18 DIAGNOSIS — F902 Attention-deficit hyperactivity disorder, combined type: Secondary | ICD-10-CM | POA: Diagnosis not present

## 2021-05-18 DIAGNOSIS — Z3200 Encounter for pregnancy test, result unknown: Secondary | ICD-10-CM | POA: Diagnosis not present

## 2021-05-18 DIAGNOSIS — F411 Generalized anxiety disorder: Secondary | ICD-10-CM | POA: Diagnosis not present

## 2021-05-18 DIAGNOSIS — Z79899 Other long term (current) drug therapy: Secondary | ICD-10-CM | POA: Diagnosis not present

## 2021-05-18 DIAGNOSIS — Z6830 Body mass index (BMI) 30.0-30.9, adult: Secondary | ICD-10-CM | POA: Diagnosis not present

## 2021-05-18 DIAGNOSIS — F3181 Bipolar II disorder: Secondary | ICD-10-CM | POA: Diagnosis not present

## 2021-05-18 DIAGNOSIS — F341 Dysthymic disorder: Secondary | ICD-10-CM | POA: Diagnosis not present

## 2021-05-23 DIAGNOSIS — Z01419 Encounter for gynecological examination (general) (routine) without abnormal findings: Secondary | ICD-10-CM | POA: Diagnosis not present

## 2021-05-23 DIAGNOSIS — Z6831 Body mass index (BMI) 31.0-31.9, adult: Secondary | ICD-10-CM | POA: Diagnosis not present

## 2021-05-24 DIAGNOSIS — H9311 Tinnitus, right ear: Secondary | ICD-10-CM | POA: Diagnosis not present

## 2021-05-24 DIAGNOSIS — H50111 Monocular exotropia, right eye: Secondary | ICD-10-CM | POA: Diagnosis not present

## 2021-05-24 DIAGNOSIS — Z5181 Encounter for therapeutic drug level monitoring: Secondary | ICD-10-CM | POA: Diagnosis not present

## 2021-06-14 DIAGNOSIS — H50111 Monocular exotropia, right eye: Secondary | ICD-10-CM | POA: Diagnosis not present

## 2021-06-14 DIAGNOSIS — H9311 Tinnitus, right ear: Secondary | ICD-10-CM | POA: Diagnosis not present

## 2021-07-31 DIAGNOSIS — S301XXA Contusion of abdominal wall, initial encounter: Secondary | ICD-10-CM | POA: Diagnosis not present

## 2021-08-18 DIAGNOSIS — F411 Generalized anxiety disorder: Secondary | ICD-10-CM | POA: Diagnosis not present

## 2021-08-18 DIAGNOSIS — Z79899 Other long term (current) drug therapy: Secondary | ICD-10-CM | POA: Diagnosis not present

## 2021-08-18 DIAGNOSIS — Z3200 Encounter for pregnancy test, result unknown: Secondary | ICD-10-CM | POA: Diagnosis not present

## 2021-08-18 DIAGNOSIS — F341 Dysthymic disorder: Secondary | ICD-10-CM | POA: Diagnosis not present

## 2021-08-18 DIAGNOSIS — F3181 Bipolar II disorder: Secondary | ICD-10-CM | POA: Diagnosis not present

## 2021-08-18 DIAGNOSIS — Z6829 Body mass index (BMI) 29.0-29.9, adult: Secondary | ICD-10-CM | POA: Diagnosis not present

## 2021-08-18 DIAGNOSIS — F902 Attention-deficit hyperactivity disorder, combined type: Secondary | ICD-10-CM | POA: Diagnosis not present

## 2021-09-22 DIAGNOSIS — R079 Chest pain, unspecified: Secondary | ICD-10-CM | POA: Diagnosis not present

## 2021-09-22 DIAGNOSIS — R091 Pleurisy: Secondary | ICD-10-CM | POA: Diagnosis not present

## 2021-09-27 ENCOUNTER — Telehealth: Payer: Self-pay | Admitting: Pulmonary Disease

## 2021-09-28 DIAGNOSIS — R091 Pleurisy: Secondary | ICD-10-CM | POA: Diagnosis not present

## 2021-09-28 DIAGNOSIS — R051 Acute cough: Secondary | ICD-10-CM | POA: Diagnosis not present

## 2021-09-28 DIAGNOSIS — R079 Chest pain, unspecified: Secondary | ICD-10-CM | POA: Diagnosis not present

## 2021-09-29 ENCOUNTER — Encounter: Payer: Self-pay | Admitting: Pulmonary Disease

## 2021-09-29 ENCOUNTER — Telehealth: Payer: Self-pay

## 2021-09-29 NOTE — Telephone Encounter (Addendum)
Spoke to patient. Patient stated that labs were ordered last ov. She did not have labs drawn, as lab was closed.  She is aware that she can come by [place] office to have labs.  Nothing further needed at this time.

## 2021-09-29 NOTE — Telephone Encounter (Signed)
Patient sent a mychart message, "I would like to get the home test and immunoglobulins test scheduled. "  Left voicemail to call back to schedule an office visit with Dr. Francine Graven as she was due in April.

## 2021-09-29 NOTE — Telephone Encounter (Signed)
This HST was ordered in February.  I tried to reach the patient ito schedule this earlier this year, with no response from the patient.  I have left a message for the patient to call me to schedule.  Mailing letter for pt to call me to schedule this.

## 2021-10-04 ENCOUNTER — Other Ambulatory Visit: Payer: Self-pay

## 2021-10-04 DIAGNOSIS — R0683 Snoring: Secondary | ICD-10-CM

## 2021-10-04 DIAGNOSIS — G4733 Obstructive sleep apnea (adult) (pediatric): Secondary | ICD-10-CM | POA: Diagnosis not present

## 2021-10-05 ENCOUNTER — Other Ambulatory Visit: Payer: BC Managed Care – PPO

## 2021-10-05 DIAGNOSIS — Z8619 Personal history of other infectious and parasitic diseases: Secondary | ICD-10-CM

## 2021-10-06 DIAGNOSIS — G4733 Obstructive sleep apnea (adult) (pediatric): Secondary | ICD-10-CM | POA: Diagnosis not present

## 2021-10-06 LAB — IGG, IGA, IGM
IgG (Immunoglobin G), Serum: 671 mg/dL (ref 600–1640)
IgM, Serum: 114 mg/dL (ref 50–300)
Immunoglobulin A: 76 mg/dL (ref 47–310)

## 2021-11-01 ENCOUNTER — Encounter: Payer: Self-pay | Admitting: Pulmonary Disease

## 2021-11-01 NOTE — Telephone Encounter (Signed)
Dr. Francine Graven could you please review last HST results? Thank you

## 2021-11-30 NOTE — Telephone Encounter (Signed)
MR or RA would either of you be willing to see this pt?  She does not want to see JD anymore.  See message below:   I am very unhappy with Dr. Francine Graven as my doctor. I originally came in, because of recurring sickness and a chronic cough. I do not believe my weight is the cause of either of those things, considering when I weighed 30 pounds less, I still had these issues. If none of the doctors there are willing to work to help me get to the bottom of the causes, then please let me know and I will go to a different pulmonologist.

## 2021-12-01 ENCOUNTER — Telehealth: Payer: Self-pay | Admitting: Internal Medicine

## 2021-12-01 NOTE — Telephone Encounter (Signed)
If cough - ok for me to see If sleep apnea - better with Dr Vassie Loll If she wants both addressed by 1 doc - then Dr Vassie Loll  Copying Dr Vassie Loll too

## 2021-12-01 NOTE — Telephone Encounter (Signed)
Called patient in regards to setting up OFV, will try again later.

## 2021-12-06 NOTE — Telephone Encounter (Signed)
I think I might have replied to this message earlier.  I am happy to see her but if she has sleep apnea and sleep-related issues such as insomnia she is probably better off seeing a board-certified sleep specialist

## 2021-12-06 NOTE — Telephone Encounter (Signed)
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 Stated that she does not seem like JD addressed her cough and that is what her main concern is as anytime when she gets sick, her symptoms go directly to her chest.  Have scheduled pt a new pt appt with MR 2/9. Nothing further needed.

## 2022-01-11 ENCOUNTER — Ambulatory Visit (INDEPENDENT_AMBULATORY_CARE_PROVIDER_SITE_OTHER): Payer: BC Managed Care – PPO | Admitting: Internal Medicine

## 2022-01-11 ENCOUNTER — Encounter: Payer: Self-pay | Admitting: Internal Medicine

## 2022-01-11 ENCOUNTER — Other Ambulatory Visit: Payer: Self-pay

## 2022-01-11 VITALS — BP 124/82 | HR 97 | Temp 98.3°F | Ht 68.0 in | Wt 194.0 lb

## 2022-01-11 DIAGNOSIS — O3680X0 Pregnancy with inconclusive fetal viability, not applicable or unspecified: Secondary | ICD-10-CM | POA: Diagnosis not present

## 2022-01-11 DIAGNOSIS — R053 Chronic cough: Secondary | ICD-10-CM | POA: Diagnosis not present

## 2022-01-11 DIAGNOSIS — N926 Irregular menstruation, unspecified: Secondary | ICD-10-CM | POA: Diagnosis not present

## 2022-01-11 DIAGNOSIS — J387 Other diseases of larynx: Secondary | ICD-10-CM

## 2022-01-11 DIAGNOSIS — N912 Amenorrhea, unspecified: Secondary | ICD-10-CM | POA: Diagnosis not present

## 2022-01-11 DIAGNOSIS — Z3491 Encounter for supervision of normal pregnancy, unspecified, first trimester: Secondary | ICD-10-CM | POA: Diagnosis not present

## 2022-01-11 DIAGNOSIS — F4322 Adjustment disorder with anxiety: Secondary | ICD-10-CM | POA: Diagnosis not present

## 2022-01-11 LAB — CBC WITH DIFFERENTIAL/PLATELET
Basophils Absolute: 0.1 10*3/uL (ref 0.0–0.1)
Basophils Relative: 1 % (ref 0.0–3.0)
Eosinophils Absolute: 0 10*3/uL (ref 0.0–0.7)
Eosinophils Relative: 0.5 % (ref 0.0–5.0)
HCT: 37.7 % (ref 36.0–46.0)
Hemoglobin: 12.7 g/dL (ref 12.0–15.0)
Lymphocytes Relative: 29.9 % (ref 12.0–46.0)
Lymphs Abs: 2 10*3/uL (ref 0.7–4.0)
MCHC: 33.8 g/dL (ref 30.0–36.0)
MCV: 92.5 fl (ref 78.0–100.0)
Monocytes Absolute: 0.5 10*3/uL (ref 0.1–1.0)
Monocytes Relative: 7.1 % (ref 3.0–12.0)
Neutro Abs: 4.1 10*3/uL (ref 1.4–7.7)
Neutrophils Relative %: 61.5 % (ref 43.0–77.0)
Platelets: 373 10*3/uL (ref 150.0–400.0)
RBC: 4.08 Mil/uL (ref 3.87–5.11)
RDW: 12.5 % (ref 11.5–15.5)
WBC: 6.6 10*3/uL (ref 4.0–10.5)

## 2022-01-11 NOTE — Progress Notes (Signed)
Synopsis: Referred in January 2022 for Chronic Cough  Subjective:   PATIENT ID: Rose Gray GENDER: female DOB: December 20, 1995, MRN: [id number]   HPI  Chief Complaint  Patient presents with   Consult    Persistent cough   Rose Gray is a 26 year old woman, never smoker with history of childhood asthma who is referred to pulmonary clinic for chronic cough.   She reports having cough since 2018. The cough is dry and can be aggravated by seasonal allergies due to ragweed or strong scents such as a fajita dish at a [employer]. She denies wheezing or chest tightness. She does report heartburn and has previously been treated with PPI therapy by GI. She had an EGD in 2020 which was normal. She does sleep on a wedge pillow. She denies dysphagia.   She denies sinus congestion or drainage unless she is acutely sick.   She reports snoring and apneic events when she sleeps per her boyfriend. She does not feel rested in the morning when she wakes.   She reports frequent viral and bacterial infections over recent years on a frequent b   OV 01/11/2022  Subjective:  Patient ID: Rose Gray, female , DOB: Oct 17, 1996 , age 26 y.o. , MRN: [id number] , ADDRESS: [address] [place] [address] PCP Medicine, [facility] Patient Care Team: Medicine, [facility] as PCP - General (Family Medicine)  This Provider for this visit: Treatment Team:  Attending Provider: Kalman Shan, MD    01/11/2022 -   Chief Complaint  Patient presents with   Follow-up    Pt states she has had the cough for about 4 years. Denies any complaints of wheezing. States if she has a coughing spell that takes a while to go away, she will have come SOB.     HPI Rose Gray 26 y.o. -referral for chronic cough.  Switching care from Dr. Irena Cords.  Referred by her cousin Lowella Bandy who is a respiratory therapist at [facility].  Patient has  chronic refractory cough for 4 years.  She tells me that as a young person/child she had asthma.  The asthma was associated with cough.  She then outgrew this.  But then for approximately 4 to 5 years ago she started having frequent respiratory infections and since then has had insidious onset of chronic cough that is persistent.  Possibly getting worse with time.  The cough is present Gray but there are variations from time to time.  Cough is made worse by fire smoke or smell or humidity or moving around and respiratory infections.  There is no relieving factor for the cough although daytime cough is significantly worse in the nighttime cough.  In the night occasionally she will cough but it will not wake her up.  Talking does not make her cough.  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  Even after the stop talking on the phone the cough is not better.  There is a laryngeal quality to the cough [on my personal observation today].  She feels irritated in the upper central part of the chest behind the sternum.  Sometimes she will have globs of sputum.  Cough happens at random.  Acid reflux therapy trial lasted did not work. Unclear if she has tried inhalers RSI cough score 16 consistent with irritable larynx syndrome A few years ago per history had skin allergy testing and apparently this was normal except  for mold.   Denies any dysphagia wheezing or fever or paroxysmal nocturnal dyspnea.  Has shortness of breath occasionally with coughing.  In 2020 admitted for flu and rhinovirus sepsis.  Not on the ventilator.  Of note: She feels she could be pregnant.  Therefore she does not want to do empiric gabapentin or radiologic imaging procedures at this point.  She wants to keep the investigation safe due to a possible pregnancy.  She is willing to do minor noninvasive procedures and medications and do not pose a pregnancy risk.  CT Chest data  No results found.    PFT  No flowsheet  data found.     has a past medical history of Anxiety, Asthma, GERD (gastroesophageal reflux disease), and Hypertension.   reports that she has never smoked. She has been exposed to tobacco smoke. She has never used smokeless tobacco.  Past Surgical History:  Procedure Laterality Date   NO PAST SURGERIES     TONSILLECTOMY N/A 04/25/2018   Procedure: TONSILLECTOMY;  Surgeon: Christia Reading, MD;  Location: [facility] OR;  Service: ENT;  Laterality: N/A;    Allergies  Allergen Reactions   Vancomycin Itching and Other (See Comments)    Became flushed in the face and itching and redness on scalp     There is no immunization history on file for this patient.  Family History  Problem Relation Age of Onset   Diabetes Mellitus II Maternal Grandmother    Heart attack Father    Ulcerative colitis Father    Diverticulitis Mother    Colon polyps Paternal Grandmother    Colon polyps Sister    Colon cancer Neg Hx    Esophageal cancer Neg Hx    Rectal cancer Neg Hx    Stomach cancer Neg Hx      Current Outpatient Medications:    albuterol (VENTOLIN HFA) 108 (90 Base) MCG/ACT inhaler, Inhale 2 puffs into the lungs every 6 (six) hours as needed for wheezing or shortness of breath. (Patient not taking: Reported on 01/11/2022), Disp: 8 g, Rfl: 6   amphetamine-dextroamphetamine (ADDERALL) 20 MG tablet, Take 1 tablet by mouth in the morning and at bedtime. (Patient not taking: Reported on 01/11/2022), Disp: , Rfl:    Oxcarbazepine (TRILEPTAL) 300 MG tablet, Take by mouth. (Patient not taking: Reported on 01/11/2022), Disp: , Rfl:    sertraline (ZOLOFT) 25 MG tablet, Take by mouth. (Patient not taking: Reported on 01/11/2022), Disp: , Rfl:       Objective:   Vitals:   01/11/22 0914  BP: 124/82  Pulse: 97  Temp: 98.3 F (36.8 C)  TempSrc: Oral  SpO2: 100%  Weight: 194 lb (88 kg)  Height: 5\' 8"  (1.727 m)    Estimated body mass index is 29.5 kg/m as calculated from the following:   Height as of  this encounter: 5\' 8"  (1.727 m).   Weight as of this encounter: 194 lb (88 kg).  @WEIGHTCHANGE @  [employer]   01/11/22 0914  Weight: 194 lb (88 kg)     Physical Exam  General: No distress. Looks well Neuro: Alert and Oriented x 3. GCS 15. Speech normal Psych: Pleasant Resp:  Barrel Chest - no.  Wheeze - no, Crackles - no, No overt respiratory distress CVS: Normal heart sounds. Murmurs - no Ext: Stigmata of Connective Tissue Disease - no HEENT: Normal upper airway. PEERL +. No post nasal drip        Assessment:       ICD-10-CM  1. Chronic cough  R05.3     2. Irritable larynx syndrome  J38.7          Plan:     Patient Instructions     ICD-10-CM   1. Chronic cough  R05.3     2. Irritable larynx syndrome  J38.7       You have cyclical cough/LPR cough or cough neuropathy or irritalbe larynx syndrome or CRC - chronic refractory cough    - various terms for same issue Contributors are typicaly acid reflux, allergies, sinus issues, lung disesae (rare), airway disease such as asthma or just unclear causes  Plan - do blood allergy test and IgE, cbc with diff  - do FENO breathing test - do full PFT  - for #Cyclical cough/Irritable Larynx element  - please choose 2-3 days and observe complete voice rest - no talking or whispering   - at all times there  there is urge to cough, drink water or swallow or sip on throat lozenge - hold off referral to voice rehab (shared decision making -  hold off gabapentin (shared decision making)  - hold off CT chest and CT sinus (due to possible pregnancy concern; shared decision making)   #Followup -video visit with APP  Tammy in few weeks to review results and next steps    SIGNATURE    Dr. Brand Males, M.D., F.C.C.P,  Pulmonary and Critical Care Medicine Staff Physician, [place] Director - Interstitial Lung Disease  Program  [facility] [place], [place], [address]  Pager: [phone], If no answer or between  15:00h - 7:00h: call [phone] Telephone: [phone]  9:50 AM 01/11/2022

## 2022-01-11 NOTE — Addendum Note (Signed)
Addended by: Wyvonne Lenz on: 01/11/2022 09:58 AM   Modules accepted: Orders

## 2022-01-11 NOTE — Patient Instructions (Signed)
ICD-10-CM   1. Chronic cough  R05.3     2. Irritable larynx syndrome  J38.7       You have cyclical cough/LPR cough or cough neuropathy or irritalbe larynx syndrome or CRC - chronic refractory cough    - various terms for same issue Contributors are typicaly acid reflux, allergies, sinus issues, lung disesae (rare), airway disease such as asthma or just unclear causes  Plan - do blood allergy test and IgE, cbc with diff  - do FENO breathing test - do full PFT  - for #Cyclical cough/Irritable Larynx element  - please choose 2-3 days and observe complete voice rest - no talking or whispering   - at all times there  there is urge to cough, drink water or swallow or sip on throat lozenge - hold off referral to voice rehab (shared decision making -  hold off gabapentin (shared decision making)  - hold off CT chest and CT sinus (due to possible pregnancy concern; shared decision making)   #Followup -video visit with APP  Tammy in few weeks to review results and next steps

## 2022-01-12 LAB — RESPIRATORY ALLERGY PROFILE REGION II ~~LOC~~

## 2022-01-12 LAB — INTERPRETATION:

## 2022-01-24 DIAGNOSIS — Z6829 Body mass index (BMI) 29.0-29.9, adult: Secondary | ICD-10-CM | POA: Insufficient documentation

## 2022-01-24 DIAGNOSIS — Z8709 Personal history of other diseases of the respiratory system: Secondary | ICD-10-CM | POA: Insufficient documentation

## 2022-01-24 DIAGNOSIS — H9319 Tinnitus, unspecified ear: Secondary | ICD-10-CM | POA: Insufficient documentation

## 2022-01-24 DIAGNOSIS — F319 Bipolar disorder, unspecified: Secondary | ICD-10-CM | POA: Insufficient documentation

## 2022-01-24 DIAGNOSIS — R053 Chronic cough: Secondary | ICD-10-CM | POA: Insufficient documentation

## 2022-01-30 ENCOUNTER — Ambulatory Visit (INDEPENDENT_AMBULATORY_CARE_PROVIDER_SITE_OTHER): Payer: BC Managed Care – PPO | Admitting: Internal Medicine

## 2022-01-30 ENCOUNTER — Other Ambulatory Visit: Payer: Self-pay

## 2022-01-30 DIAGNOSIS — R053 Chronic cough: Secondary | ICD-10-CM

## 2022-01-30 LAB — PULMONARY FUNCTION TEST
DL/VA % pred: 107 %
DL/VA: 4.96 ml/min/mmHg/L
DLCO cor % pred: 114 %
DLCO cor: 28.27 ml/min/mmHg
DLCO unc % pred: 112 %
DLCO unc: 27.64 ml/min/mmHg
FEF 25-75 Post: 6.7 L/sec
FEF 25-75 Pre: 6.09 L/sec
FEF2575-%Change-Post: 10 %
FEF2575-%Pred-Post: 175 %
FEF2575-%Pred-Pre: 159 %
FEV1-%Change-Post: 1 %
FEV1-%Pred-Post: 125 %
FEV1-%Pred-Pre: 123 %
FEV1-Post: 4.46 L
FEV1-Pre: 4.41 L
FEV1FVC-%Change-Post: 2 %
FEV1FVC-%Pred-Pre: 105 %
FEV6-%Change-Post: 0 %
FEV6-%Pred-Post: 115 %
FEV6-%Pred-Pre: 117 %
FEV6-Post: 4.81 L
FEV6-Pre: 4.85 L
FEV6FVC-%Pred-Post: 100 %
FEV6FVC-%Pred-Pre: 100 %
FVC-%Change-Post: -1 %
FVC-%Pred-Post: 115 %
FVC-%Pred-Pre: 116 %
FVC-Post: 4.81 L
FVC-Pre: 4.87 L
Post FEV1/FVC ratio: 93 %
Post FEV6/FVC ratio: 100 %
Pre FEV1/FVC ratio: 91 %
Pre FEV6/FVC Ratio: 100 %
RV % pred: 95 %
RV: 1.35 L
TLC % pred: 112 %
TLC: 6.21 L

## 2022-01-30 LAB — NITRIC OXIDE: Nitric Oxide: 5

## 2022-01-30 NOTE — Addendum Note (Signed)
Addended by: Katrinka Blazing R on: 01/30/2022 05:03 PM   Modules accepted: Orders

## 2022-01-30 NOTE — Progress Notes (Signed)
PFT done today. 

## 2022-02-02 ENCOUNTER — Other Ambulatory Visit: Payer: Self-pay

## 2022-02-02 ENCOUNTER — Telehealth (INDEPENDENT_AMBULATORY_CARE_PROVIDER_SITE_OTHER): Payer: BC Managed Care – PPO | Admitting: Adult Health

## 2022-02-02 ENCOUNTER — Encounter: Payer: Self-pay | Admitting: Adult Health

## 2022-02-02 DIAGNOSIS — J387 Other diseases of larynx: Secondary | ICD-10-CM | POA: Diagnosis not present

## 2022-02-02 DIAGNOSIS — R053 Chronic cough: Secondary | ICD-10-CM

## 2022-02-02 NOTE — Patient Instructions (Addendum)
Begin Robitussin 2 teaspoons every 4-6hrs, for cough as needed ?Begin Zyrtec 10 mg at bedtime ?Begin Pepcid 20 mg at bedtime ?Sips of water to prevent cough or throat clearing ?Avoid mint products ?Follow-up in 2 months with Dr. Marchelle Gearing and as needed ?Please contact office for sooner follow up if symptoms do not improve or worsen or seek emergency care  ? ? ?

## 2022-02-02 NOTE — Progress Notes (Signed)
Virtual Visit via Video Note ? ?I connected with Rose Gray on 02/02/22 at  4:30 PM EST by a video enabled telemedicine application and verified that I am speaking with the correct person using two identifiers. ? ?Location: ?Patient: Home  ?Provider: Office  ?  ?I discussed the limitations of evaluation and management by telemedicine and the availability of in person appointments. The patient expressed understanding and agreed to proceed. ? ?History of Present Illness: ?26 year old female never smoker followed for mild obstructive sleep apnea and chronic cough (since 2018) ? ?Today's video visit is a 1 month follow-up for chronic cough.  Patient complains that she has had ongoing chronic cough that is minimally productive over the last 5 years.  Seems to be aggravated by strong scents.  She has been treated for possible reflux with PPI therapy with no significant improvement.  Lab work was done last visit for an allergy panel that was negative for allergens and IgE was 6.  Eosinophils were normal/absolute count was 0.  Immunoglobulin panel November 2022 was normal.  Chest x-ray March 2020 was normal.  She was set up for PFTs that were done on January 30, 2022 that showed normal lung function with FEV1 at 125%, ratio 93, FVC 115%, DLCO 112%. ?Currently pregnant , [redacted] weeks along .  ?We went over her test results.  Patient says that she is currently not using anything for cough.  Says that her cough is mainly dry. ? ? ? ?Past Medical History:  ?Diagnosis Date  ? Anxiety   ? Asthma   ? childhood  ? GERD (gastroesophageal reflux disease)   ? Hypertension   ? ?Current Outpatient Medications on File Prior to Visit  ?Medication Sig Dispense Refill  ? prenatal vitamin w/FE, FA (PRENATAL 1 + 1) 27-1 MG TABS tablet Take 1 tablet by mouth daily at 12 noon.    ? ?No current facility-administered medications on file prior to visit.  ? ? ? ?Observations/Objective: ?Appears in NAD  ? ?Assessment and Plan: ?Chronic cough x5  years.  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  PFTs are essentially normal.  Allergy panel was unrevealing.  Could have a component of reactive airways.  Has tried albuterol in the past without any perceived benefit.  We will treat for triggers such as allergic rhinitis/postnasal drainage and reflux.  Along with cough control with Robitussin.  Will be more challenging as she is pregnant and choosing appropriate medications. ? ? ?Plan  ?Patient Instructions  ?Begin Robitussin 2 teaspoons every 4-6hrs, for cough as needed ?Begin Zyrtec 10 mg at bedtime ?Begin Pepcid 20 mg at bedtime ?Sips of water to prevent cough or throat clearing ?Avoid mint products ?Follow-up in 2 months with Dr. Marchelle Gearing and as needed ?Please contact office for sooner follow up if symptoms do not improve or worsen or seek emergency care  ? ?  ? ? ?Follow Up Instructions: ? ?  ?I discussed the assessment and treatment plan with the patient. The patient was provided an opportunity to ask questions and all were answered. The patient agreed with the plan and demonstrated an understanding of the instructions. ?  ?The patient was advised to call back or seek an in-person evaluation if the symptoms worsen or if the condition fails to improve as anticipated. ? ?I provided 23  minutes of non-face-to-face time during this encounter. ? ? ?Rubye Oaks, NP ? ?

## 2022-02-07 DIAGNOSIS — F4322 Adjustment disorder with anxiety: Secondary | ICD-10-CM | POA: Diagnosis not present

## 2022-02-07 DIAGNOSIS — Z3400 Encounter for supervision of normal first pregnancy, unspecified trimester: Secondary | ICD-10-CM | POA: Diagnosis not present

## 2022-02-07 DIAGNOSIS — Z6830 Body mass index (BMI) 30.0-30.9, adult: Secondary | ICD-10-CM | POA: Diagnosis not present

## 2022-02-07 DIAGNOSIS — Z124 Encounter for screening for malignant neoplasm of cervix: Secondary | ICD-10-CM | POA: Diagnosis not present

## 2022-03-22 DIAGNOSIS — Z369 Encounter for antenatal screening, unspecified: Secondary | ICD-10-CM | POA: Diagnosis not present

## 2022-04-19 DIAGNOSIS — Z362 Encounter for other antenatal screening follow-up: Secondary | ICD-10-CM | POA: Diagnosis not present

## 2022-05-17 DIAGNOSIS — Z3400 Encounter for supervision of normal first pregnancy, unspecified trimester: Secondary | ICD-10-CM | POA: Diagnosis not present

## 2022-05-23 DIAGNOSIS — R7309 Other abnormal glucose: Secondary | ICD-10-CM | POA: Diagnosis not present

## 2022-06-14 DIAGNOSIS — Z23 Encounter for immunization: Secondary | ICD-10-CM | POA: Diagnosis not present

## 2022-07-25 DIAGNOSIS — O26843 Uterine size-date discrepancy, third trimester: Secondary | ICD-10-CM | POA: Diagnosis not present

## 2022-07-25 DIAGNOSIS — Z3400 Encounter for supervision of normal first pregnancy, unspecified trimester: Secondary | ICD-10-CM | POA: Diagnosis not present

## 2022-07-26 DIAGNOSIS — R609 Edema, unspecified: Secondary | ICD-10-CM | POA: Diagnosis not present

## 2022-08-09 DIAGNOSIS — O368390 Maternal care for abnormalities of the fetal heart rate or rhythm, unspecified trimester, not applicable or unspecified: Secondary | ICD-10-CM | POA: Diagnosis not present

## 2022-08-13 DIAGNOSIS — O471 False labor at or after 37 completed weeks of gestation: Secondary | ICD-10-CM | POA: Diagnosis not present

## 2022-08-13 DIAGNOSIS — Z3A38 38 weeks gestation of pregnancy: Secondary | ICD-10-CM | POA: Diagnosis not present

## 2022-08-13 DIAGNOSIS — O368130 Decreased fetal movements, third trimester, not applicable or unspecified: Secondary | ICD-10-CM | POA: Diagnosis not present

## 2022-08-13 DIAGNOSIS — F319 Bipolar disorder, unspecified: Secondary | ICD-10-CM | POA: Diagnosis not present

## 2022-08-13 DIAGNOSIS — Z7722 Contact with and (suspected) exposure to environmental tobacco smoke (acute) (chronic): Secondary | ICD-10-CM | POA: Diagnosis not present

## 2022-08-13 DIAGNOSIS — J45909 Unspecified asthma, uncomplicated: Secondary | ICD-10-CM | POA: Diagnosis not present

## 2022-08-21 DIAGNOSIS — Z3400 Encounter for supervision of normal first pregnancy, unspecified trimester: Secondary | ICD-10-CM | POA: Diagnosis not present

## 2022-08-24 DIAGNOSIS — O4202 Full-term premature rupture of membranes, onset of labor within 24 hours of rupture: Secondary | ICD-10-CM | POA: Diagnosis not present

## 2022-08-24 DIAGNOSIS — O368130 Decreased fetal movements, third trimester, not applicable or unspecified: Secondary | ICD-10-CM | POA: Diagnosis not present

## 2022-08-24 DIAGNOSIS — O99344 Other mental disorders complicating childbirth: Secondary | ICD-10-CM | POA: Diagnosis not present

## 2022-08-24 DIAGNOSIS — M5489 Other dorsalgia: Secondary | ICD-10-CM | POA: Diagnosis not present

## 2022-08-24 DIAGNOSIS — Z23 Encounter for immunization: Secondary | ICD-10-CM | POA: Diagnosis not present

## 2022-08-24 DIAGNOSIS — Z3403 Encounter for supervision of normal first pregnancy, third trimester: Secondary | ICD-10-CM | POA: Diagnosis not present

## 2022-08-24 DIAGNOSIS — O368330 Maternal care for abnormalities of the fetal heart rate or rhythm, third trimester, not applicable or unspecified: Secondary | ICD-10-CM | POA: Diagnosis not present

## 2022-08-24 DIAGNOSIS — Z3A40 40 weeks gestation of pregnancy: Secondary | ICD-10-CM | POA: Diagnosis not present

## 2022-08-24 DIAGNOSIS — F4322 Adjustment disorder with anxiety: Secondary | ICD-10-CM | POA: Diagnosis not present

## 2022-08-24 DIAGNOSIS — Z881 Allergy status to other antibiotic agents status: Secondary | ICD-10-CM | POA: Diagnosis not present

## 2022-08-24 DIAGNOSIS — F319 Bipolar disorder, unspecified: Secondary | ICD-10-CM | POA: Diagnosis not present

## 2022-08-24 DIAGNOSIS — O480 Post-term pregnancy: Secondary | ICD-10-CM | POA: Diagnosis not present

## 2022-10-08 DIAGNOSIS — B372 Candidiasis of skin and nail: Secondary | ICD-10-CM | POA: Diagnosis not present

## 2022-10-23 DIAGNOSIS — N898 Other specified noninflammatory disorders of vagina: Secondary | ICD-10-CM | POA: Insufficient documentation

## 2022-10-23 LAB — HM PAP SMEAR

## 2022-12-03 DIAGNOSIS — Z419 Encounter for procedure for purposes other than remedying health state, unspecified: Secondary | ICD-10-CM | POA: Diagnosis not present

## 2023-01-03 DIAGNOSIS — Z419 Encounter for procedure for purposes other than remedying health state, unspecified: Secondary | ICD-10-CM | POA: Diagnosis not present

## 2023-02-01 DIAGNOSIS — Z419 Encounter for procedure for purposes other than remedying health state, unspecified: Secondary | ICD-10-CM | POA: Diagnosis not present

## 2023-02-15 DIAGNOSIS — J020 Streptococcal pharyngitis: Secondary | ICD-10-CM | POA: Diagnosis not present

## 2023-02-15 DIAGNOSIS — Z6833 Body mass index (BMI) 33.0-33.9, adult: Secondary | ICD-10-CM | POA: Diagnosis not present

## 2023-03-04 DIAGNOSIS — Z419 Encounter for procedure for purposes other than remedying health state, unspecified: Secondary | ICD-10-CM | POA: Diagnosis not present

## 2023-04-03 DIAGNOSIS — Z419 Encounter for procedure for purposes other than remedying health state, unspecified: Secondary | ICD-10-CM | POA: Diagnosis not present

## 2023-05-04 DIAGNOSIS — Z419 Encounter for procedure for purposes other than remedying health state, unspecified: Secondary | ICD-10-CM | POA: Diagnosis not present

## 2023-05-27 ENCOUNTER — Encounter: Payer: Self-pay | Admitting: Family Medicine

## 2023-05-27 ENCOUNTER — Telehealth: Payer: Self-pay

## 2023-05-27 NOTE — Telephone Encounter (Signed)
Called pt to pre-chart for upcoming [facility] visit

## 2023-05-28 ENCOUNTER — Ambulatory Visit (INDEPENDENT_AMBULATORY_CARE_PROVIDER_SITE_OTHER): Payer: Medicaid Other | Admitting: Family Medicine

## 2023-05-28 ENCOUNTER — Encounter: Payer: Self-pay | Admitting: Family Medicine

## 2023-05-28 VITALS — BP 118/78 | HR 85 | Temp 98.4°F | Ht 68.0 in | Wt 212.2 lb

## 2023-05-28 DIAGNOSIS — Z1159 Encounter for screening for other viral diseases: Secondary | ICD-10-CM | POA: Diagnosis not present

## 2023-05-28 DIAGNOSIS — Z13228 Encounter for screening for other metabolic disorders: Secondary | ICD-10-CM

## 2023-05-28 DIAGNOSIS — F4322 Adjustment disorder with anxiety: Secondary | ICD-10-CM | POA: Diagnosis not present

## 2023-05-28 DIAGNOSIS — Z13220 Encounter for screening for lipoid disorders: Secondary | ICD-10-CM

## 2023-05-28 DIAGNOSIS — Z7689 Persons encountering health services in other specified circumstances: Secondary | ICD-10-CM

## 2023-05-28 DIAGNOSIS — F319 Bipolar disorder, unspecified: Secondary | ICD-10-CM

## 2023-05-28 DIAGNOSIS — Z130 Encounter for screening for diseases of the blood and blood-forming organs and certain disorders involving the immune mechanism: Secondary | ICD-10-CM | POA: Diagnosis not present

## 2023-05-28 DIAGNOSIS — Z1329 Encounter for screening for other suspected endocrine disorder: Secondary | ICD-10-CM

## 2023-05-28 DIAGNOSIS — Z862 Personal history of diseases of the blood and blood-forming organs and certain disorders involving the immune mechanism: Secondary | ICD-10-CM | POA: Diagnosis not present

## 2023-05-28 LAB — COMPREHENSIVE METABOLIC PANEL
ALT: 23 U/L (ref 0–35)
AST: 17 U/L (ref 0–37)
Albumin: 4.3 g/dL (ref 3.5–5.2)
Alkaline Phosphatase: 69 U/L (ref 39–117)
BUN: 11 mg/dL (ref 6–23)
CO2: 22 mEq/L (ref 19–32)
Calcium: 9.3 mg/dL (ref 8.4–10.5)
Chloride: 106 mEq/L (ref 96–112)
Creatinine, Ser: 0.81 mg/dL (ref 0.40–1.20)
GFR: 100.52 mL/min (ref >60.00)
Glucose, Bld: 76 mg/dL (ref 70–99)
Potassium: 3.9 mEq/L (ref 3.5–5.1)
Sodium: 138 mEq/L (ref 135–145)
Total Bilirubin: 0.5 mg/dL (ref 0.2–1.2)
Total Protein: 7.2 g/dL (ref 6.0–8.3)

## 2023-05-28 LAB — CBC WITH DIFFERENTIAL/PLATELET
Basophils Absolute: 0 10*3/uL (ref 0.0–0.1)
Basophils Relative: 0.8 % (ref 0.0–3.0)
Eosinophils Absolute: 0.1 10*3/uL (ref 0.0–0.7)
Eosinophils Relative: 1.9 % (ref 0.0–5.0)
HCT: 41.8 % (ref 36.0–46.0)
Hemoglobin: 13.9 g/dL (ref 12.0–15.0)
Lymphocytes Relative: 41.7 % (ref 12.0–46.0)
Lymphs Abs: 2.5 10*3/uL (ref 0.7–4.0)
MCHC: 33.2 g/dL (ref 30.0–36.0)
MCV: 93.4 fl (ref 78.0–100.0)
Monocytes Absolute: 0.4 10*3/uL (ref 0.1–1.0)
Monocytes Relative: 6.8 % (ref 3.0–12.0)
Neutro Abs: 3 10*3/uL (ref 1.4–7.7)
Neutrophils Relative %: 48.8 % (ref 43.0–77.0)
Platelets: 379 10*3/uL (ref 150.0–400.0)
RBC: 4.47 Mil/uL (ref 3.87–5.11)
RDW: 12.9 % (ref 11.5–15.5)
WBC: 6.1 10*3/uL (ref 4.0–10.5)

## 2023-05-28 LAB — LIPID PANEL
Cholesterol: 103 mg/dL (ref 0–200)
HDL: 42.2 mg/dL (ref >39.00)
LDL Cholesterol: 43 mg/dL (ref 0–99)
NonHDL: 61.16
Total CHOL/HDL Ratio: 2
Triglycerides: 89 mg/dL (ref 0.0–149.0)
VLDL: 17.8 mg/dL (ref 0.0–40.0)

## 2023-05-28 LAB — TSH: TSH: 1.05 u[IU]/mL (ref 0.35–5.50)

## 2023-05-28 NOTE — Progress Notes (Signed)
New Patient Office Visit  Subjective    Patient ID: Rose Gray, female    DOB: 02-05-1996  Age: 27 y.o. MRN: [id number]  CC:  Chief Complaint  Patient presents with   Establish Care    Pt is here today to [facility] Pt would like to discuss medication for depression and bi-polar Pt is FASTING today Pt has concerns about her iron levels    HPI Rose Gray presents to establish care with new provider  Patients previous primary care provider was [facility] [place], Dr. Jonette Mate. Thinks she had 2 visits with this provider. Prior to [place], she was with [facility] with Marquita Palms, FNP. Last visit was in 2021  Specialist: [facility] in [place] with Dr. Mertie Moores Previously seen [facility] in [place] with Leotis Shames Asencion Noble) [employer], FNP-last seen at the end of 2022 for mental health.    Patient is complaining of anxiety and being more irritable. Denies depression. Denies SI or HI. History of anxiety and bipolar II. Previously been on medication and would like to restart medication.   Patient states after c-section she had to take iron pills but never had a lab drawn to confirm that she was not having problems with anemia. Denies feeling fatigue. But, concerned about iron levels.  Outpatient Encounter Medications as of 05/28/2023  Medication Sig   [DISCONTINUED] fluticasone-salmeterol (ADVAIR DISKUS) 100-50 MCG/ACT AEPB Inhale into the lungs.   [DISCONTINUED] ibuprofen (ADVIL) 800 MG tablet Take by mouth.   [DISCONTINUED] ferrous sulfate 325 (65 FE) MG tablet Take by mouth. (Patient not taking: Reported on 05/28/2023)   [DISCONTINUED] prenatal vitamin w/FE, FA (PRENATAL 1 + 1) 27-1 MG TABS tablet Take 1 tablet by mouth daily at 12 noon.   No facility-administered encounter medications on file as of 05/28/2023.    Past Medical History:  Diagnosis Date   Anxiety    Asthma     childhood   Bipolar 2 disorder (HCC)    Depression    GERD (gastroesophageal reflux disease)    Hypertension     Past Surgical History:  Procedure Laterality Date   CESAREAN SECTION  2023   NO PAST SURGERIES     TONSILLECTOMY N/A 04/25/2018   Procedure: TONSILLECTOMY;  Surgeon: Christia Reading, MD;  Location: [facility] OR;  Service: ENT;  Laterality: N/A;    Family History  Problem Relation Age of Onset   Diverticulitis Mother    Kidney failure Mother    Anxiety disorder Mother    Bipolar disorder Mother    Depression Mother    Heart attack Father    Ulcerative colitis Father    COPD Father    Colon polyps Sister    Anxiety disorder Sister    Depression Sister    Bipolar disorder Sister    ADD / ADHD Brother    Hypertension Brother    Cancer Maternal Grandmother        Breast cancer   Diabetes Mellitus II Maternal Grandmother    Colon polyps Paternal Grandmother    Colon cancer Neg Hx    Esophageal cancer Neg Hx    Rectal cancer Neg Hx    Stomach cancer Neg Hx     Social History   Socioeconomic History   Marital status: Single    Spouse name: Not on file   Number of children: 1   Years of education: Not on file   Highest education level: High school graduate  Occupational History   Not on file  Tobacco Use   Smoking status: Never    Passive exposure: Yes   Smokeless tobacco: Never  Vaping Use   Vaping Use: Never used  Substance and Sexual Activity   Alcohol use: No   Drug use: Never   Sexual activity: Yes    Birth control/protection: None  Other Topics Concern   Not on file  Social History Narrative   Not on file   Social Determinants of Health   Financial Resource Strain: Low Risk  (05/27/2023)   Overall Financial Resource Strain (CARDIA)    Difficulty of Paying Living Expenses: Not hard at all  Food Insecurity: No Food Insecurity (05/27/2023)   Hunger Vital Sign    Worried About Running Out of Food in the Last Year: Never true    Ran Out of Food in  the Last Year: Never true  Transportation Needs: No Transportation Needs (05/28/2023)   PRAPARE - Administrator, Civil Service (Medical): No    Lack of Transportation (Non-Medical): No  Physical Activity: Insufficiently Active (05/27/2023)   Exercise Vital Sign    Days of Exercise per Week: 2 days    Minutes of Exercise per Session: 30 min  Stress: No Stress Concern Present (05/27/2023)   [employer] of Occupational Health - Occupational Stress Questionnaire    Feeling of Stress : Not at all  Social Connections: Moderately Isolated (05/27/2023)   Social Connection and Isolation Panel [NHANES]    Frequency of Communication with Friends and Family: More than three times a week    Frequency of Social Gatherings with Friends and Family: Three times a week    Attends Religious Services: Never    Active Member of Clubs or Organizations: No    Attends Banker Meetings: Never    Marital Status: Living with partner  Intimate Partner Violence: Not At Risk (05/27/2023)   Humiliation, Afraid, Rape, and Kick questionnaire    Fear of Current or Ex-Partner: No    Emotionally Abused: No    Physically Abused: No    Sexually Abused: No    ROS See HPI above    Objective   BP 118/78   Pulse 85   Temp 98.4 F (36.9 C)   Ht 5\' 8"  (1.727 m)   Wt 212 lb 4 oz (96.3 kg)   SpO2 98%   BMI 32.27 kg/m   Physical Exam Vitals reviewed.  Constitutional:      General: She is not in acute distress.    Appearance: Normal appearance. She is obese. She is not ill-appearing, toxic-appearing or diaphoretic.  Eyes:     General:        Right eye: No discharge.        Left eye: No discharge.     Conjunctiva/sclera: Conjunctivae normal.  Cardiovascular:     Rate and Rhythm: Normal rate and regular rhythm.     Heart sounds: Normal heart sounds. No murmur heard.    No friction rub. No gallop.  Pulmonary:     Effort: Pulmonary effort is normal. No respiratory distress.     Breath  sounds: Normal breath sounds.  Musculoskeletal:        General: Normal range of motion.  Skin:    General: Skin is warm and dry.  Neurological:     General: No focal deficit present.     Mental Status: She is alert and oriented to person, place, and time. Mental status is at  baseline.  Psychiatric:        Attention and Perception: She does not perceive auditory or visual hallucinations.        Mood and Affect: Mood is anxious. Mood is not depressed.        Behavior: Behavior normal. Behavior is cooperative.        Thought Content: Thought content normal. Thought content does not include homicidal or suicidal ideation.        Cognition and Memory: Cognition normal.        Judgment: Judgment normal.      Assessment & Plan:  Bipolar 1 disorder (HCC) -     Ambulatory referral to Psychology  Adjustment disorder with anxiety -     Ambulatory referral to Psychology  Need for hepatitis C screening test -     Hepatitis C antibody  History of anemia -     Iron, TIBC and Ferritin Panel -     CBC with Differential/Platelet  Screening for endocrine, metabolic and immunity disorder -     Comprehensive metabolic panel -     TSH  Lipid screening -     Lipid panel  Encounter to establish care  1.Review health maintenance:  -Declines covid vaccine and booster -Order Hep C screening. -HPV-once vaccine completed 2.Placed a referral to psych for medication management with anxiety and bipolar II.  3.Ordered screening labs and based on history of anxiety. Patient is fasting.   Return in about 1 year (around 05/27/2024) for physical.   Zandra Abts, NP

## 2023-05-28 NOTE — Patient Instructions (Signed)
It was pleasure to meet you and take care of you. -Placed a referral to psychiatry for medication management for anxiety and bi-polar II. If you do not hear about an appointment n 2 weeks, please give the office a call.  -Ordered screening labs. Office will call with lab results and you may see them on MyChart. -Follow up in 1 year for a physical.

## 2023-05-29 ENCOUNTER — Telehealth: Payer: Self-pay

## 2023-05-29 ENCOUNTER — Encounter: Payer: Self-pay | Admitting: Family Medicine

## 2023-05-29 LAB — IRON,TIBC AND FERRITIN PANEL
%SAT: 22 % (calc) (ref 16–45)
Ferritin: 16 ng/mL (ref 16–154)
Iron: 100 ug/dL (ref 40–190)
TIBC: 461 mcg/dL (calc) — ABNORMAL HIGH (ref 250–450)

## 2023-05-29 LAB — HEPATITIS C ANTIBODY: Hepatitis C Ab: NONREACTIVE

## 2023-05-29 NOTE — Telephone Encounter (Signed)
-----   Message from Alveria Apley, NP sent at 05/29/2023  7:48 AM EDT ----- Your labs are all stable.

## 2023-05-29 NOTE — Telephone Encounter (Signed)
-----   Message from Alveria Apley, NP sent at 05/29/2023  1:51 PM EDT ----- Hep C is non reactive.

## 2023-06-03 DIAGNOSIS — Z419 Encounter for procedure for purposes other than remedying health state, unspecified: Secondary | ICD-10-CM | POA: Diagnosis not present

## 2023-07-04 DIAGNOSIS — Z419 Encounter for procedure for purposes other than remedying health state, unspecified: Secondary | ICD-10-CM | POA: Diagnosis not present

## 2023-07-05 DIAGNOSIS — F900 Attention-deficit hyperactivity disorder, predominantly inattentive type: Secondary | ICD-10-CM | POA: Diagnosis not present

## 2023-07-05 DIAGNOSIS — F4310 Post-traumatic stress disorder, unspecified: Secondary | ICD-10-CM | POA: Diagnosis not present

## 2023-07-05 DIAGNOSIS — F411 Generalized anxiety disorder: Secondary | ICD-10-CM | POA: Diagnosis not present

## 2023-07-05 DIAGNOSIS — F3132 Bipolar disorder, current episode depressed, moderate: Secondary | ICD-10-CM | POA: Diagnosis not present

## 2023-07-05 DIAGNOSIS — Z79899 Other long term (current) drug therapy: Secondary | ICD-10-CM | POA: Diagnosis not present

## 2023-07-30 DIAGNOSIS — F4310 Post-traumatic stress disorder, unspecified: Secondary | ICD-10-CM | POA: Diagnosis not present

## 2023-07-30 DIAGNOSIS — F411 Generalized anxiety disorder: Secondary | ICD-10-CM | POA: Diagnosis not present

## 2023-07-30 DIAGNOSIS — F3132 Bipolar disorder, current episode depressed, moderate: Secondary | ICD-10-CM | POA: Diagnosis not present

## 2023-07-30 DIAGNOSIS — F900 Attention-deficit hyperactivity disorder, predominantly inattentive type: Secondary | ICD-10-CM | POA: Diagnosis not present

## 2023-08-04 DIAGNOSIS — Z419 Encounter for procedure for purposes other than remedying health state, unspecified: Secondary | ICD-10-CM | POA: Diagnosis not present

## 2023-09-03 DIAGNOSIS — Z419 Encounter for procedure for purposes other than remedying health state, unspecified: Secondary | ICD-10-CM | POA: Diagnosis not present

## 2023-10-04 DIAGNOSIS — Z419 Encounter for procedure for purposes other than remedying health state, unspecified: Secondary | ICD-10-CM | POA: Diagnosis not present

## 2023-10-10 DIAGNOSIS — F411 Generalized anxiety disorder: Secondary | ICD-10-CM | POA: Diagnosis not present

## 2023-10-10 DIAGNOSIS — F900 Attention-deficit hyperactivity disorder, predominantly inattentive type: Secondary | ICD-10-CM | POA: Diagnosis not present

## 2023-10-10 DIAGNOSIS — F3132 Bipolar disorder, current episode depressed, moderate: Secondary | ICD-10-CM | POA: Diagnosis not present

## 2023-10-10 DIAGNOSIS — F4310 Post-traumatic stress disorder, unspecified: Secondary | ICD-10-CM | POA: Diagnosis not present

## 2023-10-15 ENCOUNTER — Encounter: Payer: Self-pay | Admitting: Family Medicine

## 2023-10-18 ENCOUNTER — Ambulatory Visit (INDEPENDENT_AMBULATORY_CARE_PROVIDER_SITE_OTHER): Payer: Medicaid Other | Admitting: Family Medicine

## 2023-10-18 VITALS — BP 126/88 | HR 97 | Temp 97.9°F | Ht 68.0 in | Wt 201.2 lb

## 2023-10-18 DIAGNOSIS — R5383 Other fatigue: Secondary | ICD-10-CM | POA: Diagnosis not present

## 2023-10-18 DIAGNOSIS — N926 Irregular menstruation, unspecified: Secondary | ICD-10-CM | POA: Diagnosis not present

## 2023-10-18 DIAGNOSIS — Z862 Personal history of diseases of the blood and blood-forming organs and certain disorders involving the immune mechanism: Secondary | ICD-10-CM

## 2023-10-18 DIAGNOSIS — Z124 Encounter for screening for malignant neoplasm of cervix: Secondary | ICD-10-CM

## 2023-10-18 LAB — TSH: TSH: 1.4 u[IU]/mL (ref 0.35–5.50)

## 2023-10-18 LAB — CBC WITH DIFFERENTIAL/PLATELET
Basophils Absolute: 0.1 10*3/uL (ref 0.0–0.1)
Basophils Relative: 1.1 % (ref 0.0–3.0)
Eosinophils Absolute: 0.1 10*3/uL (ref 0.0–0.7)
Eosinophils Relative: 0.9 % (ref 0.0–5.0)
HCT: 43.5 % (ref 36.0–46.0)
Hemoglobin: 14.8 g/dL (ref 12.0–15.0)
Lymphocytes Relative: 34.8 % (ref 12.0–46.0)
Lymphs Abs: 2.2 10*3/uL (ref 0.7–4.0)
MCHC: 34 g/dL (ref 30.0–36.0)
MCV: 94.1 fL (ref 78.0–100.0)
Monocytes Absolute: 0.5 10*3/uL (ref 0.1–1.0)
Monocytes Relative: 7.1 % (ref 3.0–12.0)
Neutro Abs: 3.6 10*3/uL (ref 1.4–7.7)
Neutrophils Relative %: 56.1 % (ref 43.0–77.0)
Platelets: 423 10*3/uL — ABNORMAL HIGH (ref 150.0–400.0)
RBC: 4.62 Mil/uL (ref 3.87–5.11)
RDW: 12.8 % (ref 11.5–15.5)
WBC: 6.5 10*3/uL (ref 4.0–10.5)

## 2023-10-18 LAB — VITAMIN D 25 HYDROXY (VIT D DEFICIENCY, FRACTURES): VITD: 34.41 ng/mL (ref 30.00–100.00)

## 2023-10-18 LAB — COMPREHENSIVE METABOLIC PANEL
ALT: 17 U/L (ref 0–35)
AST: 15 U/L (ref 0–37)
Albumin: 4.5 g/dL (ref 3.5–5.2)
Alkaline Phosphatase: 76 U/L (ref 39–117)
BUN: 14 mg/dL (ref 6–23)
CO2: 25 meq/L (ref 19–32)
Calcium: 9.5 mg/dL (ref 8.4–10.5)
Chloride: 103 meq/L (ref 96–112)
Creatinine, Ser: 0.99 mg/dL (ref 0.40–1.20)
GFR: 78.79 mL/min (ref >60.00)
Glucose, Bld: 102 mg/dL — ABNORMAL HIGH (ref 70–99)
Potassium: 3.9 meq/L (ref 3.5–5.1)
Sodium: 138 meq/L (ref 135–145)
Total Bilirubin: 0.6 mg/dL (ref 0.2–1.2)
Total Protein: 7.4 g/dL (ref 6.0–8.3)

## 2023-10-18 LAB — VITAMIN B12: Vitamin B-12: 244 pg/mL (ref 211–911)

## 2023-10-18 NOTE — Patient Instructions (Addendum)
-  Placed a referral to GYN to establish care with a new provider and based on irregular menstrual cycles. If you do not receive a phone call or MyChart message about an appointment in 2 weeks, please call the office or send a message through MyChart.  -Ordered labs based on fatigue and history of anemia. Office will call with lab results and you will see them on MyChart.

## 2023-10-18 NOTE — Progress Notes (Signed)
Established Patient Office Visit   Subjective:  Patient ID: Rose Gray, female    DOB: 04-03-1996  Age: 27 y.o. MRN: [id number]  Chief Complaint  Patient presents with   Fatigue    Pt reports she stay feeling tired with no energy. Been going on 2-3 wks. Also would like a referral to new OBGYN. Previous one with in [place]. Just recently moved to [place].     HPI Patient reports she has started feeling fatigued that stared 2-3 weeks ago. Started more all of a sudden. She reports she is fatigued daily. Sleeping about 8 hours a night and taking naps when her child is sleeping. She reports she has recently started Vraylar 3mg  in October, but unsure if her fatigue is really related to the medication. She reports she has not took the medication for a day and still feel the same way Also, has a history of anemia.   Patient is requesting a referral to GYN. She has recently moved from [place] to [place], [place]. She would like to establish care with a closer provider. Also, reports since July she has noticed her menstrual has become more irregular, coming sooner each month. She reports she is having a longer menstrual cycle.   ROS See HPI above     Objective:   BP 126/88 (BP Location: Left Arm, Patient Position: Sitting, Cuff Size: Large)   Pulse 97   Temp 97.9 F (36.6 C) (Oral)   Ht 5\' 8"  (1.727 m)   Wt 201 lb 3.2 oz (91.3 kg)   LMP 09/26/2023 (Approximate)   SpO2 98%   BMI 30.59 kg/m    Physical Exam Vitals reviewed.  Constitutional:      General: She is not in acute distress.    Appearance: Normal appearance. She is obese. She is not ill-appearing, toxic-appearing or diaphoretic.  HENT:     Head: Normocephalic and atraumatic.  Eyes:     General:        Right eye: No discharge.        Left eye: No discharge.     Conjunctiva/sclera: Conjunctivae normal.  Cardiovascular:     Rate and Rhythm: Normal rate and regular rhythm.     Heart sounds: Normal heart  sounds. No murmur heard.    No friction rub. No gallop.  Pulmonary:     Effort: Pulmonary effort is normal. No respiratory distress.     Breath sounds: Normal breath sounds.  Musculoskeletal:        General: Normal range of motion.  Skin:    General: Skin is warm and dry.  Neurological:     General: No focal deficit present.     Mental Status: She is alert and oriented to person, place, and time. Mental status is at baseline.  Psychiatric:        Mood and Affect: Mood normal.        Behavior: Behavior normal.        Thought Content: Thought content normal.        Judgment: Judgment normal.      Assessment & Plan:  Fatigue, unspecified type -     CBC with Differential/Platelet -     Iron, TIBC and Ferritin Panel -     Comprehensive metabolic panel -     Vitamin B12 -     VITAMIN D 25 Hydroxy (Vit-D Deficiency, Fractures) -     TSH  History of anemia -     CBC with Differential/Platelet -  Iron, TIBC and Ferritin Panel  Cervical cancer screening -     Ambulatory referral to Gynecology  Irregular menstrual cycle -     Ambulatory referral to Gynecology  1.Review health maintenance: -Covid booster: Declines  -Influenza vaccine: Declines  -HPV vaccine: Will discuss with GYN 2.Placed a referral to GYN to establish care with a new provider and based on irregular menstrual cycles. Advised if she does not receive a phone call or MyChart message about an appointment in 2 weeks, please call the office or send a message through MyChart.  3.Ordered labs based on fatigue and history of anemia (CBC with Diff, CMP, Iron panel, Vitamin B12/D, and TSH).    Zandra Abts, NP

## 2023-10-19 LAB — IRON,TIBC AND FERRITIN PANEL
%SAT: 28 % (ref 16–45)
Ferritin: 20 ng/mL (ref 16–154)
Iron: 132 ug/dL (ref 40–190)
TIBC: 466 ug/dL — ABNORMAL HIGH (ref 250–450)

## 2023-10-25 DIAGNOSIS — H5213 Myopia, bilateral: Secondary | ICD-10-CM | POA: Diagnosis not present

## 2023-11-03 DIAGNOSIS — Z419 Encounter for procedure for purposes other than remedying health state, unspecified: Secondary | ICD-10-CM | POA: Diagnosis not present

## 2023-11-05 DIAGNOSIS — F4310 Post-traumatic stress disorder, unspecified: Secondary | ICD-10-CM | POA: Diagnosis not present

## 2023-11-05 DIAGNOSIS — F900 Attention-deficit hyperactivity disorder, predominantly inattentive type: Secondary | ICD-10-CM | POA: Diagnosis not present

## 2023-11-05 DIAGNOSIS — F411 Generalized anxiety disorder: Secondary | ICD-10-CM | POA: Diagnosis not present

## 2023-11-05 DIAGNOSIS — F3132 Bipolar disorder, current episode depressed, moderate: Secondary | ICD-10-CM | POA: Diagnosis not present

## 2023-12-04 DIAGNOSIS — Z419 Encounter for procedure for purposes other than remedying health state, unspecified: Secondary | ICD-10-CM | POA: Diagnosis not present

## 2023-12-05 DIAGNOSIS — F3132 Bipolar disorder, current episode depressed, moderate: Secondary | ICD-10-CM | POA: Diagnosis not present

## 2023-12-05 DIAGNOSIS — F4310 Post-traumatic stress disorder, unspecified: Secondary | ICD-10-CM | POA: Diagnosis not present

## 2023-12-05 DIAGNOSIS — F411 Generalized anxiety disorder: Secondary | ICD-10-CM | POA: Diagnosis not present

## 2023-12-05 DIAGNOSIS — F900 Attention-deficit hyperactivity disorder, predominantly inattentive type: Secondary | ICD-10-CM | POA: Diagnosis not present

## 2023-12-09 ENCOUNTER — Encounter (HOSPITAL_BASED_OUTPATIENT_CLINIC_OR_DEPARTMENT_OTHER): Payer: Self-pay | Admitting: Certified Nurse Midwife

## 2023-12-09 ENCOUNTER — Ambulatory Visit (INDEPENDENT_AMBULATORY_CARE_PROVIDER_SITE_OTHER): Payer: Medicaid Other | Admitting: Certified Nurse Midwife

## 2023-12-09 VITALS — BP 134/82 | HR 105 | Ht 68.0 in | Wt 194.1 lb

## 2023-12-09 DIAGNOSIS — N922 Excessive menstruation at puberty: Secondary | ICD-10-CM

## 2023-12-09 DIAGNOSIS — N926 Irregular menstruation, unspecified: Secondary | ICD-10-CM

## 2023-12-09 MED ORDER — VITAMIN D (ERGOCALCIFEROL) 1.25 MG (50000 UNIT) PO CAPS: 50000.0000 [IU] | ORAL_CAPSULE | ORAL | 11 refills | Status: AC

## 2023-12-09 NOTE — Progress Notes (Signed)
  Rose Gray is a 28yo MWF G1P1001 here for new patient gyn visit to discuss irregular menstrual periods. Her son Megan is 15 months. She used withdrawal for contraception before pregnancy and uses withdrawal since delivery. States her periods before pregnancy were every 28 days and regular but very heavy. She bled for 4 weeks after delivery then bleeding stopped. Her period resumed at 6 weeks postpartum. Period seemed normal every 28 days until July 2024. Since July, periods are sometimes every 21-22 days apart. They continue to be heavy on days 1-3. She wears pads and changes frequently. Periods last 5 days. Patient requesting to have hormone levels checked. She reports a hx of an ovarian cyst.    Past Medical History:  Diagnosis Date   Anxiety    Asthma    childhood   Bipolar 2 disorder (HCC)    Depression    GERD (gastroesophageal reflux disease)    Hypertension     MEDS:   Current Outpatient Medications on File Prior to Visit  Medication Sig Dispense Refill   amphetamine-dextroamphetamine (ADDERALL XR) 10 MG 24 hr capsule Take 10 mg by mouth every morning.     VRAYLAR 3 MG capsule Take 3 mg by mouth daily.     No current facility-administered medications on file prior to visit.    ALLERGIES: Vancomycin   SH:  lives with spouse and healthy 27 month old  Review of Systems  Constitutional: Negative.   HENT: Negative.    Eyes: Negative.   Respiratory: Negative.    Cardiovascular: Negative.   Gastrointestinal: Negative.   Musculoskeletal: Negative.   Skin: Negative.   Neurological: Negative.   Endo/Heme/Allergies: Negative.   All other systems reviewed and are negative.   PHYSICAL EXAMINATION:    BP 134/82 (BP Location: Right Arm, Patient Position: Sitting, Cuff Size: Normal)   Pulse (!) 105   Ht 5' 8 (1.727 m)   Wt 194 lb 1.6 oz (88 kg)   LMP 11/18/2023   BMI 29.51 kg/m     General appearance: alert, cooperative and appears stated  age  Assessment/Plan: 1. Irregular menses (Primary)/Menorrhagia - Recent TSH normal - FSH - LH - Progesterone  - Recommended Vitamin B12 5,000mcg sublingual daily - Vitamin D  50,000iu once weekly  RTO 1 year for annual gyn exam and prn if issues arise. Most recent pap March 2023 Negative/Normal. Next pap due March 2026.  Arland MARLA Roller

## 2023-12-10 LAB — PROGESTERONE: Progesterone: 12.7 ng/mL

## 2023-12-10 LAB — FOLLICLE STIMULATING HORMONE: FSH: 4 m[IU]/mL

## 2023-12-10 LAB — LUTEINIZING HORMONE: LH: 4.3 m[IU]/mL

## 2023-12-18 DIAGNOSIS — F4310 Post-traumatic stress disorder, unspecified: Secondary | ICD-10-CM | POA: Diagnosis not present

## 2023-12-18 DIAGNOSIS — F411 Generalized anxiety disorder: Secondary | ICD-10-CM | POA: Diagnosis not present

## 2023-12-25 ENCOUNTER — Ambulatory Visit (HOSPITAL_BASED_OUTPATIENT_CLINIC_OR_DEPARTMENT_OTHER): Payer: Medicaid Other

## 2023-12-25 ENCOUNTER — Encounter (HOSPITAL_BASED_OUTPATIENT_CLINIC_OR_DEPARTMENT_OTHER): Payer: Self-pay | Admitting: Certified Nurse Midwife

## 2023-12-25 ENCOUNTER — Ambulatory Visit (HOSPITAL_BASED_OUTPATIENT_CLINIC_OR_DEPARTMENT_OTHER): Payer: Medicaid Other | Admitting: Certified Nurse Midwife

## 2023-12-25 VITALS — BP 122/88 | HR 68 | Ht 68.0 in | Wt 186.6 lb

## 2023-12-25 DIAGNOSIS — N920 Excessive and frequent menstruation with regular cycle: Secondary | ICD-10-CM | POA: Diagnosis not present

## 2023-12-25 DIAGNOSIS — N926 Irregular menstruation, unspecified: Secondary | ICD-10-CM | POA: Diagnosis not present

## 2023-12-25 NOTE — Progress Notes (Signed)
Rose Gray is a 28yo G1P1 here for follow-up US after visit earlier in January. She reported periods "more frequent" than usual/normal, every 21-22 days and "heavy" on days 1,2,3. Periods last approx 5 days.  She had a menstrual period 12/15 and then again 12/15/23 lasting 5 days. Pt states the January period seemed "more normal" and less heavy than the prior one.  They use withdrawal for contraception. Her son Rose Gray is 1 and doing well. She does not desire contraception or hormonal therapy for menorrhagia.   [place]: Anteverted uterus appears normal in size without evidence of focal abnormalities. EM is trilaminar with no blood flow. Multiple small follicles does not follow PCOS pattern. Ovaries appear normal bilaterally. Neg adnexa regions bilaterally. Neg cul de sac. No free fluid present. CXL wnl.  Assessment: Menorrhagia  Plan: Pt does not desire medication or hormonal therapy at this time. Anemia was ruled out. Prefers to continue w/ withdrawal for contraception. She will RTO January 2026 for annual gyn exam and prn if issues arise.  Rose Gray

## 2024-01-01 DIAGNOSIS — F411 Generalized anxiety disorder: Secondary | ICD-10-CM | POA: Diagnosis not present

## 2024-01-01 DIAGNOSIS — F4310 Post-traumatic stress disorder, unspecified: Secondary | ICD-10-CM | POA: Diagnosis not present

## 2024-01-02 DIAGNOSIS — F3132 Bipolar disorder, current episode depressed, moderate: Secondary | ICD-10-CM | POA: Diagnosis not present

## 2024-01-02 DIAGNOSIS — F900 Attention-deficit hyperactivity disorder, predominantly inattentive type: Secondary | ICD-10-CM | POA: Diagnosis not present

## 2024-01-02 DIAGNOSIS — F411 Generalized anxiety disorder: Secondary | ICD-10-CM | POA: Diagnosis not present

## 2024-01-02 DIAGNOSIS — F4310 Post-traumatic stress disorder, unspecified: Secondary | ICD-10-CM | POA: Diagnosis not present

## 2024-01-02 DIAGNOSIS — Z79899 Other long term (current) drug therapy: Secondary | ICD-10-CM | POA: Diagnosis not present

## 2024-01-04 DIAGNOSIS — Z419 Encounter for procedure for purposes other than remedying health state, unspecified: Secondary | ICD-10-CM | POA: Diagnosis not present

## 2024-01-14 DIAGNOSIS — F4310 Post-traumatic stress disorder, unspecified: Secondary | ICD-10-CM | POA: Diagnosis not present

## 2024-01-14 DIAGNOSIS — F411 Generalized anxiety disorder: Secondary | ICD-10-CM | POA: Diagnosis not present

## 2024-01-21 DIAGNOSIS — F4310 Post-traumatic stress disorder, unspecified: Secondary | ICD-10-CM | POA: Diagnosis not present

## 2024-01-21 DIAGNOSIS — F411 Generalized anxiety disorder: Secondary | ICD-10-CM | POA: Diagnosis not present

## 2024-02-01 DIAGNOSIS — Z419 Encounter for procedure for purposes other than remedying health state, unspecified: Secondary | ICD-10-CM | POA: Diagnosis not present

## 2024-02-04 DIAGNOSIS — F900 Attention-deficit hyperactivity disorder, predominantly inattentive type: Secondary | ICD-10-CM | POA: Diagnosis not present

## 2024-02-04 DIAGNOSIS — F4310 Post-traumatic stress disorder, unspecified: Secondary | ICD-10-CM | POA: Diagnosis not present

## 2024-02-04 DIAGNOSIS — F4323 Adjustment disorder with mixed anxiety and depressed mood: Secondary | ICD-10-CM | POA: Diagnosis not present

## 2024-02-04 DIAGNOSIS — F411 Generalized anxiety disorder: Secondary | ICD-10-CM | POA: Diagnosis not present

## 2024-02-14 ENCOUNTER — Encounter: Payer: Self-pay | Admitting: Family Medicine

## 2024-02-14 DIAGNOSIS — Z91018 Allergy to other foods: Secondary | ICD-10-CM

## 2024-02-26 DIAGNOSIS — F411 Generalized anxiety disorder: Secondary | ICD-10-CM | POA: Diagnosis not present

## 2024-02-26 DIAGNOSIS — F4323 Adjustment disorder with mixed anxiety and depressed mood: Secondary | ICD-10-CM | POA: Diagnosis not present

## 2024-02-26 DIAGNOSIS — F4310 Post-traumatic stress disorder, unspecified: Secondary | ICD-10-CM | POA: Diagnosis not present

## 2024-02-26 DIAGNOSIS — F900 Attention-deficit hyperactivity disorder, predominantly inattentive type: Secondary | ICD-10-CM | POA: Diagnosis not present

## 2024-03-10 DIAGNOSIS — F4310 Post-traumatic stress disorder, unspecified: Secondary | ICD-10-CM | POA: Diagnosis not present

## 2024-03-10 DIAGNOSIS — F900 Attention-deficit hyperactivity disorder, predominantly inattentive type: Secondary | ICD-10-CM | POA: Diagnosis not present

## 2024-03-10 DIAGNOSIS — F411 Generalized anxiety disorder: Secondary | ICD-10-CM | POA: Diagnosis not present

## 2024-03-14 DIAGNOSIS — Z419 Encounter for procedure for purposes other than remedying health state, unspecified: Secondary | ICD-10-CM | POA: Diagnosis not present

## 2024-03-24 ENCOUNTER — Ambulatory Visit (INDEPENDENT_AMBULATORY_CARE_PROVIDER_SITE_OTHER): Admitting: Allergy & Immunology

## 2024-03-24 ENCOUNTER — Encounter: Payer: Self-pay | Admitting: Allergy & Immunology

## 2024-03-24 ENCOUNTER — Other Ambulatory Visit: Payer: Self-pay

## 2024-03-24 VITALS — BP 112/82 | HR 82 | Temp 98.2°F | Resp 18 | Ht 66.25 in | Wt 170.0 lb

## 2024-03-24 DIAGNOSIS — R140 Abdominal distension (gaseous): Secondary | ICD-10-CM

## 2024-03-24 DIAGNOSIS — B999 Unspecified infectious disease: Secondary | ICD-10-CM | POA: Diagnosis not present

## 2024-03-24 DIAGNOSIS — K219 Gastro-esophageal reflux disease without esophagitis: Secondary | ICD-10-CM | POA: Diagnosis not present

## 2024-03-24 NOTE — Progress Notes (Signed)
 NEW PATIENT  Date of Service/Encounter:  03/24/24  Consult requested by: Francenia Ingle, NP   Assessment:   Bloating   Gastroesophageal reflux disease  Recurrent infections  PTSD and anxiety  Plan/Recommendations:   1. Bloating - We will get some labs to look for milk and wheat. - Sheet provided so that we can do more targeted testing in the future.  - Circle the food allergen that you are interested in looking for.   2. Gastroesophageal reflux disease - This seems to be better.  - I am not going to worry about this any longer.   3. Recurrent infections - We will obtain some screening labs to evaluate your immune system.  - Labs to evaluate the quantitative [facility]) aspects of your immune system: IgG/IgA/IgM, CBC with differential - Labs to evaluate the qualitative (HOW WELL THEY WORK) aspects of your immune system: CH50, Pneumococcal titers, Tetanus titers, Diphtheria titers - We may consider immunizations with Pneumovax and Tdap to challenge your immune system, and then obtain repeat titers in 4-6 weeks.   4. Chronic rhinitis  - We will plan to do environmental allergy  testing at the next visit.  - Because of insurance stipulations, we cannot do skin testing on the same day as your first visit. - We are all working to fight this, but for now we need to do two separate visits.  - We will know more after we do testing at the next visit.  - The skin testing visit can be squeezed in at your convenience.  - Then we can make a more full plan to address all of your symptoms. - Be sure to stop your antihistamines for 3 days before this appointment.   5. Return in about 1 week (around 03/31/2024) for SKIN TESTING (1-55 + select foods). You can have the follow up appointment with Dr. Idolina Maker or a Nurse Practicioner (our Nurse Practitioners are excellent and always have Physician oversight!).    This note in its entirety was forwarded to the Provider who requested  this consultation.  Subjective:   Rose Gray is a 28 y.o. female presenting today for evaluation of  Chief Complaint  Patient presents with   Establish Care    Have always had an issue with dairy. Has lost around 60--when she eats certain foods, lots of bloating. Noticed when she didn't eat gluten or dairy, doesn't notice it as much. Does have frequent diarrhea. Have had a coloscopy done--nothing wrong.      Rose Gray has a history of the following: Patient Active Problem List   Diagnosis Date Noted   Bipolar 1 disorder (HCC) 01/24/2022   BMI 29.0-29.9,adult 01/24/2022   Chronic cough 01/24/2022   History of asthma 01/24/2022   Tinnitus 01/24/2022   Allergic rhinitis due to pollen 03/28/2020   History of tonsillectomy and adenoidectomy 03/17/2019   Chest pain 02/02/2019   Essential hypertension 02/02/2019   SIRS (systemic inflammatory response syndrome) (HCC) 02/01/2019   Adjustment disorder with anxiety 07/22/2018   Chronic tonsillitis 04/25/2018    History obtained from: chart review and patient.  Discussed the use of AI scribe software for clinical note transcription with the patient and/or guardian, who gave verbal consent to proceed.   Rose Gray was referred by Francenia Ingle, NP.     Asthma/Respiratory Symptom History: She has a history of respiratory issues, including childhood asthma, frequent upper respiratory infections, and a past hospitalization for sepsis attributed to flu and rhinovirus.  These issues have improved since having a child and staying home, reducing exposure to illnesses. She was frequently on antibiotics and steroids as a child and underwent a tonsillectomy at age 71 due to recurrent tonsillitis. She has mild sleep apnea diagnosed via a home sleep study. She has not used a CPAP machine but reports ongoing snoring. She has lost 60 pounds, which she attributes to dietary changes.  Allergic Rhinitis Symptom History: She has a  history of environmental allergies, including reactions to mold, trees, and ragweed, which previously caused symptoms like a runny nose and congestion. These symptoms have improved over time, and she is currently asymptomatic without daily allergy medication.  Food Allergy Symptom History: She experiences significant abdominal bloating and swelling after consuming certain foods, particularly dairy and possibly gluten. The swelling is described as more than typical bloating, with her stomach 'swelling up and sticking out.' She also has frequent gastrointestinal disturbances. Despite a colonoscopy in 2020 showing no abnormalities, dietary changes reducing gluten and dairy intake have led to some improvement in symptoms. No hives, itching, or shortness of breath with food are reported.  Skin Symptom History: She has a history of eczema as a child, but currently only experiences occasional small bumps on her skin that resolve spontaneously.   GERD Symptom History: She has a history of reflux as a child but does not currently take medication for it, managing symptoms through dietary awareness. She reports occasional difficulty swallowing.  Otherwise, there is no history of other atopic diseases, including drug allergies, stinging insect allergies, or contact dermatitis. There is no significant infectious history. Vaccinations are up to date.    Past Medical History: Patient Active Problem List   Diagnosis Date Noted   Bipolar 1 disorder (HCC) 01/24/2022   BMI 29.0-29.9,adult 01/24/2022   Chronic cough 01/24/2022   History of asthma 01/24/2022   Tinnitus 01/24/2022   Allergic rhinitis due to pollen 03/28/2020   History of tonsillectomy and adenoidectomy 03/17/2019   Chest pain 02/02/2019   Essential hypertension 02/02/2019   SIRS (systemic inflammatory response syndrome) (HCC) 02/01/2019   Adjustment disorder with anxiety 07/22/2018   Chronic tonsillitis 04/25/2018    Medication List:   Allergies as of 03/24/2024       Reactions   Vancomycin  Itching, Other (See Comments)   Became flushed in the face and itching and redness on scalp        Medication List        Accurate as of March 24, 2024  4:45 PM. If you have any questions, ask your nurse or doctor.          amphetamine-dextroamphetamine 10 MG 24 hr capsule Commonly known as: ADDERALL XR Take 10 mg by mouth every morning.   Caplyta 21 MG Caps Generic drug: Lumateperone Tosylate Take 1 capsule by mouth at bedtime.   vitamin B-12 100 MCG tablet Commonly known as: CYANOCOBALAMIN  Take 100 mcg by mouth daily.   Vitamin D  (Ergocalciferol ) 1.25 MG (50000 UNIT) Caps capsule Commonly known as: DRISDOL  Take 1 capsule (50,000 Units total) by mouth every 7 (seven) days.        Birth History: non-contributory  Developmental History: non-contributory  Past Surgical History: Past Surgical History:  Procedure Laterality Date   CESAREAN SECTION  2023   NO PAST SURGERIES     TONSILLECTOMY N/A 04/25/2018   Procedure: TONSILLECTOMY;  Surgeon: Virgina Grills, MD;  [facility] OR;  Service: ENT;  Laterality: N/A;   TYMPANOSTOMY TUBE PLACEMENT  Family History: Family History  Problem Relation Age of Onset   Diverticulitis Mother    Kidney failure Mother    Anxiety disorder Mother    Bipolar disorder Mother    Depression Mother    Urticaria Father    Allergic rhinitis Father    Heart attack Father    Ulcerative colitis Father    COPD Father    Urticaria Sister    Colon polyps Sister    Anxiety disorder Sister    Depression Sister    Bipolar disorder Sister    ADD / ADHD Brother    Hypertension Brother    Cancer Maternal Grandmother        Breast cancer   Diabetes Mellitus II Maternal Grandmother    Colon polyps Paternal Grandmother    Colon cancer Neg Hx    Esophageal cancer Neg Hx    Rectal cancer Neg Hx    Stomach cancer Neg Hx      Social History: Orvella lives at home with  her family. She is not working outside of the home at this point. She is taking care of her now 50 month old.    Review of systems otherwise negative other than that mentioned in the HPI.    Objective:   Blood pressure 112/82, pulse 82, temperature 98.2 F (36.8 C), temperature source Temporal, resp. rate 18, height 5' 6.25" (1.683 m), weight 170 lb (77.1 kg), SpO2 99%, not currently breastfeeding. Body mass index is 27.23 kg/m.     Physical Exam Constitutional:      Appearance: She is well-developed.  HENT:     Head: Normocephalic and atraumatic.     Right Ear: Tympanic membrane, ear canal and external ear normal. No drainage, swelling or tenderness. Tympanic membrane is not injected, scarred, erythematous, retracted or bulging.     Left Ear: Tympanic membrane, ear canal and external ear normal. No drainage, swelling or tenderness. Tympanic membrane is not injected, scarred, erythematous, retracted or bulging.     Nose: No nasal deformity, septal deviation, mucosal edema or rhinorrhea.     Right Turbinates: Enlarged, swollen and pale.     Left Turbinates: Enlarged, swollen and pale.     Right Sinus: No maxillary sinus tenderness or frontal sinus tenderness.     Left Sinus: No maxillary sinus tenderness or frontal sinus tenderness.     Mouth/Throat:     Lips: Pink.     Mouth: Mucous membranes are moist. Mucous membranes are not pale and not dry.     Pharynx: Uvula midline.  Eyes:     General: Lids are normal. Allergic shiner present.        Right eye: No discharge.        Left eye: No discharge.     Conjunctiva/sclera: Conjunctivae normal.     Right eye: Right conjunctiva is not injected. No chemosis.    Left eye: Left conjunctiva is not injected. No chemosis.    Pupils: Pupils are equal, round, and reactive to light.  Cardiovascular:     Rate and Rhythm: Normal rate and regular rhythm.     Heart sounds: Normal heart sounds.  Pulmonary:     Effort: Pulmonary effort is  normal. No tachypnea, accessory muscle usage or respiratory distress.     Breath sounds: Normal breath sounds. No wheezing, rhonchi or rales.  Chest:     Chest wall: No tenderness.  Abdominal:     Tenderness: There is no abdominal tenderness. There is no guarding or  rebound.  Lymphadenopathy:     Head:     Right side of head: No submandibular, tonsillar or occipital adenopathy.     Left side of head: No submandibular, tonsillar or occipital adenopathy.     Cervical: No cervical adenopathy.  Skin:    Coloration: Skin is not pale.     Findings: No abrasion, erythema, petechiae or rash. Rash is not papular, urticarial or vesicular.  Neurological:     Mental Status: She is alert.  Psychiatric:        Behavior: Behavior is cooperative.      Diagnostic studies: labs sent instead        Drexel Gentles, MD [facility] 

## 2024-03-24 NOTE — Patient Instructions (Addendum)
 1. Bloating - We will get some labs to look for milk and wheat. - Sheet provided so that we can do more targeted testing in the future.  - Circle the food allergen that you are interested in looking for.   2. Gastroesophageal reflux disease - This seems to be better.  - I am not going to worry about this any longer.   3. Recurrent infections - We will obtain some screening labs to evaluate your immune system.  - Labs to evaluate the quantitative [facility]) aspects of your immune system: IgG/IgA/IgM, CBC with differential - Labs to evaluate the qualitative (HOW WELL THEY WORK) aspects of your immune system: CH50, Pneumococcal titers, Tetanus titers, Diphtheria titers - We may consider immunizations with Pneumovax and Tdap to challenge your immune system, and then obtain repeat titers in 4-6 weeks.   4. Chronic rhinitis  - We will plan to do environmental allergy testing at the next visit.  - Because of insurance stipulations, we cannot do skin testing on the same day as your first visit. - We are all working to fight this, but for now we need to do two separate visits.  - We will know more after we do testing at the next visit.  - The skin testing visit can be squeezed in at your convenience.  - Then we can make a more full plan to address all of your symptoms. - Be sure to stop your antihistamines for 3 days before this appointment.   5. Return in about 1 week (around 03/31/2024) for SKIN TESTING (1-55 + select foods). You can have the follow up appointment with Dr. Idolina Maker or a Nurse Practicioner (our Nurse Practitioners are excellent and always have Physician oversight!).    Please inform us  of any Emergency Department visits, hospitalizations, or changes in symptoms. Call us  before going to the ED for breathing or allergy symptoms since we might be able to fit you in for a sick visit. Feel free to contact us  anytime with any questions, problems, or concerns.  It was a pleasure to  meet you today!  Websites that have reliable patient information: 1. American Academy of Asthma, Allergy, and Immunology: www.aaaai.org 2. Food Allergy Research and Education (FARE): foodallergy.org 3. Mothers of Asthmatics: http://www.asthmacommunitynetwork.org 4. American College of Allergy, Asthma, and Immunology: www.acaai.org      "Like" us  on Facebook and Instagram for our latest updates!      A healthy democracy works best when [employer] participate! Make sure you are registered to vote! If you have moved or changed any of your contact information, you will need to get this updated before voting! Scan the QR codes below to learn more!

## 2024-03-25 ENCOUNTER — Ambulatory Visit (HOSPITAL_BASED_OUTPATIENT_CLINIC_OR_DEPARTMENT_OTHER)

## 2024-03-25 ENCOUNTER — Ambulatory Visit: Admitting: Family Medicine

## 2024-03-25 ENCOUNTER — Ambulatory Visit: Payer: Self-pay

## 2024-03-25 ENCOUNTER — Encounter (HOSPITAL_BASED_OUTPATIENT_CLINIC_OR_DEPARTMENT_OTHER): Payer: Self-pay

## 2024-03-25 ENCOUNTER — Other Ambulatory Visit (HOSPITAL_COMMUNITY)
Admission: RE | Admit: 2024-03-25 | Discharge: 2024-03-25 | Disposition: A | Discharge status: Home / Self Care | Source: Ambulatory Visit | Attending: Obstetrics & Gynecology | Admitting: Obstetrics & Gynecology

## 2024-03-25 VITALS — BP 120/83 | HR 74 | Wt 170.0 lb

## 2024-03-25 DIAGNOSIS — N898 Other specified noninflammatory disorders of vagina: Secondary | ICD-10-CM

## 2024-03-25 NOTE — Progress Notes (Signed)
 NURSE VISIT- VAGINITIS/STD/POC  SUBJECTIVE:  Rose Gray is a 28 y.o. G1P1001 GYN patientfemale here for a vaginal swab for vaginitis screening.  She reports the following symptoms: discharge described as creamy for several days. Denies abnormal vaginal bleeding, significant pelvic pain, fever, or UTI symptoms.  OBJECTIVE:  BP 120/83 (BP Location: Right Arm, Patient Position: Sitting, Cuff Size: Normal)   Pulse 74   Wt 170 lb (77.1 kg)   BMI 27.23 kg/m   Appears well, in no apparent distress  ASSESSMENT: Vaginal swab for vaginitis screening  PLAN: Self-collected vaginal probe for Yeast sent to lab Treatment: to be determined once results are received Follow-up as needed if symptoms persist/worsen, or new symptoms develop

## 2024-03-25 NOTE — Telephone Encounter (Signed)
 Copied From CRM [id number]. Reason for Triage: Yeast infection requesting medication + experiencing Itching, discharge, burning started last night gotten worse today.    Chief Complaint: vaginal itching Symptoms: discharge/ burning  Disposition: [] ED /[] Urgent Care (no appt availability in office) / [x] Appointment(In office/virtual)/ []  [place] [facility]/ [] Home Care/ [] Refused Recommended Disposition /[] [place] [facility]/ []  Follow-up with PCP Additional Notes: Pt complaining of vaginal itching, white discharge, and burning. Symptoms started last night and are worse today. Pt has virtual appt this evening at 1730. RN gave care advice and pt verbalized understanding.             Reason for Disposition  MODERATE-SEVERE itching (i.e., interferes with school, work, or sleep)  Answer Assessment - Initial Assessment Questions 1. SYMPTOM: "What's the main symptom you're concerned about?" (e.g., pain, itching, dryness)     itching  3. ONSET: "When did the   start?"     Last night 4. PAIN: "Is there any pain?" If Yes, ask: "How bad is it?" (Scale: 1-10; mild, moderate, severe)   -  MILD (1-3): Doesn't interfere with normal activities.    -  MODERATE (4-7): Interferes with normal activities (e.g., work or school) or awakens from sleep.     -  SEVERE (8-10): Excruciating pain, unable to do any normal activities.     denies 5. ITCHING: "Is there any itching?" If Yes, ask: "How bad is it?" (Scale: 1-10; mild, moderate, severe)     7 6. CAUSE: "What do you think is causing the discharge?" "Have you had the same problem before? What happened then?"     Yeast infection 7. OTHER SYMPTOMS: "Do you have any other symptoms?" (e.g., fever, itching, vaginal bleeding, pain with urination, injury to genital area, vaginal foreign body)     discharge 8. PREGNANCY: "Is there any chance you are pregnant?" "When was your last menstrual period?"     no  Protocols used: Vaginal Symptoms-A-AH

## 2024-03-26 ENCOUNTER — Encounter (HOSPITAL_BASED_OUTPATIENT_CLINIC_OR_DEPARTMENT_OTHER): Payer: Self-pay | Admitting: Certified Nurse Midwife

## 2024-03-26 LAB — CERVICOVAGINAL ANCILLARY ONLY
Bacterial Vaginitis (gardnerella): NEGATIVE
Candida Glabrata: NEGATIVE
Candida Vaginitis: NEGATIVE
Comment: NEGATIVE
Comment: NEGATIVE
Comment: NEGATIVE

## 2024-03-27 DIAGNOSIS — R82998 Other abnormal findings in urine: Secondary | ICD-10-CM | POA: Diagnosis not present

## 2024-03-27 DIAGNOSIS — N760 Acute vaginitis: Secondary | ICD-10-CM | POA: Diagnosis not present

## 2024-03-27 DIAGNOSIS — R350 Frequency of micturition: Secondary | ICD-10-CM | POA: Diagnosis not present

## 2024-03-27 DIAGNOSIS — N898 Other specified noninflammatory disorders of vagina: Secondary | ICD-10-CM | POA: Diagnosis not present

## 2024-03-27 DIAGNOSIS — Z3202 Encounter for pregnancy test, result negative: Secondary | ICD-10-CM | POA: Diagnosis not present

## 2024-03-30 ENCOUNTER — Encounter (HOSPITAL_BASED_OUTPATIENT_CLINIC_OR_DEPARTMENT_OTHER): Payer: Self-pay | Admitting: Obstetrics and Gynecology

## 2024-03-30 ENCOUNTER — Ambulatory Visit (HOSPITAL_BASED_OUTPATIENT_CLINIC_OR_DEPARTMENT_OTHER): Admitting: Obstetrics and Gynecology

## 2024-03-30 VITALS — BP 125/92 | HR 80 | Ht 67.0 in | Wt 170.0 lb

## 2024-03-30 DIAGNOSIS — N9489 Other specified conditions associated with female genital organs and menstrual cycle: Secondary | ICD-10-CM

## 2024-03-30 DIAGNOSIS — R300 Dysuria: Secondary | ICD-10-CM

## 2024-03-30 DIAGNOSIS — N760 Acute vaginitis: Secondary | ICD-10-CM | POA: Diagnosis not present

## 2024-03-30 LAB — POCT URINALYSIS DIPSTICK
Bilirubin, UA: NEGATIVE
Glucose, UA: NEGATIVE
Ketones, UA: NEGATIVE
Nitrite, UA: NEGATIVE
Protein, UA: NEGATIVE
Spec Grav, UA: 1.01 (ref 1.010–1.025)
Urobilinogen, UA: 0.2 U/dL
pH, UA: 6.5 (ref 5.0–8.0)

## 2024-03-30 MED ORDER — FLUCONAZOLE 150 MG PO TABS: 150.0000 mg | ORAL_TABLET | Freq: Once | ORAL | 1 refills | Status: AC

## 2024-03-30 MED ORDER — TRIAMCINOLONE ACETONIDE 0.1 % EX CREA
1.0000 | TOPICAL_CREAM | Freq: Two times a day (BID) | CUTANEOUS | 0 refills | Status: DC
Start: 2024-03-30 — End: 2024-05-28

## 2024-03-30 MED ORDER — NYSTATIN 100000 UNIT/GM EX CREA: TOPICAL_CREAM | CUTANEOUS | 1 refills | Status: DC

## 2024-03-30 MED ORDER — NITROFURANTOIN MONOHYD MACRO 100 MG PO CAPS: 100.0000 mg | ORAL_CAPSULE | Freq: Two times a day (BID) | ORAL | 0 refills | Status: DC

## 2024-03-30 NOTE — Progress Notes (Signed)
   GYNECOLOGY PROGRESS NOTE  History:  28 y.o. G1P1001 presents to [facility] for vaginal symptoms.  Reports Sunday had burning with urination and  then on Tuesday reports burning and itching. Was prescribed diflucan. . Has taken 2 diflucan which has helped alleviate most itching. Endorses burning,irritation and frequent urination. Of note reports she had a boil before all of this started that drained on its own.  She is concerned about continued symptoms d/t hx of sepsis from flu/rhinitis   The following portions of the patient's history were reviewed and updated as appropriate: allergies, current medications, past family history, past medical history, past social history, past surgical history and problem list. Last pap smear on 10/23/22 was normal, neg HRHPV.  Health Maintenance Due  Topic Date Due   COVID-19 Vaccine (1) Never done   Pneumococcal Vaccine 54-65 Years old (1 of 2 - PCV) Never done   HPV VACCINES (2 - 3-dose series) 08/21/2019     Review of Systems:  Pertinent items are noted in HPI.   Objective:  Physical Exam Blood pressure (!) 125/92, pulse 80, height 5\' 7"  (1.702 m), weight 170 lb (77.1 kg), last menstrual period 03/09/2024, not currently breastfeeding. VS reviewed, nursing note reviewed,  Constitutional: well developed, well nourished, no distress HEENT: normocephalic Pulm/chest wall: normal effort Abdomen: soft Neuro: alert and oriented  Skin: warm, dry Psych: affect normal Pelvic exam:Vulva erythematous, slight thin grey d/c, no lesion or fissure present      Assessment & Plan:  1. Dysuria (Primary) Given continued symptoms and trace leuks will send tx while waiting for urine culture  - POCT Urinalysis Dipstick - Urine Culture  2. Vulvovaginitis Discussed irritation, will treat with topical meds - nystatin cream (MYCOSTATIN); Apply thin layer after each breastfeeding or pumping session, Continue to use for 1-2 weeks after all symptoms have  resolved.  Dispense: 30 g; Refill: 1 - triamcinolone  cream (KENALOG) 0.1 %; Apply 1 Application topically 2 (two) times daily.  Dispense: 30 g; Refill: 0  3. Vaginal burning  - nitrofurantoin, macrocrystal-monohydrate, (MACROBID) 100 MG capsule; Take 1 capsule (100 mg total) by mouth 2 (two) times daily.  Dispense: 14 capsule; Refill: 0  Follow up if symptoms worsen or do not resolve    Susi Eric, FNP

## 2024-03-31 ENCOUNTER — Encounter: Payer: Self-pay | Admitting: Allergy & Immunology

## 2024-03-31 ENCOUNTER — Ambulatory Visit (INDEPENDENT_AMBULATORY_CARE_PROVIDER_SITE_OTHER): Admitting: Allergy & Immunology

## 2024-03-31 DIAGNOSIS — R140 Abdominal distension (gaseous): Secondary | ICD-10-CM | POA: Diagnosis not present

## 2024-03-31 DIAGNOSIS — K219 Gastro-esophageal reflux disease without esophagitis: Secondary | ICD-10-CM | POA: Diagnosis not present

## 2024-03-31 NOTE — Patient Instructions (Addendum)
 1. Bloating - Milk and wheat was negative on blood work. - But you could still avoid these foods if you feel better. - This is more of an intolerance rather than an allergy.  2. Gastroesophageal reflux disease - This seems to be better.   3. Recurrent infections - Everything was normal except for your Streptococcal pneumonia titers (only protective to 5 out of 23 serotypes of Streptococcal strains). - The next step is to get the Pneumovax and then get repeat titers in 4-6 weeks.   4. Chronic rhinitis  - Testing today showed: NEGATIVE to the entire panel - Copy of test results provided.  - Continue an antihistamine as needed.   5. Return in about 3 months (around 06/30/2024). You can have the follow up appointment with Dr. Idolina Maker or a Nurse Practicioner (our Nurse Practitioners are excellent and always have Physician oversight!).    Please inform us  of any Emergency Department visits, hospitalizations, or changes in symptoms. Call us  before going to the ED for breathing or allergy symptoms since we might be able to fit you in for a sick visit. Feel free to contact us  anytime with any questions, problems, or concerns.  It was a pleasure to meet you today!  Websites that have reliable patient information: 1. American Academy of Asthma, Allergy, and Immunology: www.aaaai.org 2. Food Allergy Research and Education (FARE): foodallergy.org 3. Mothers of Asthmatics: http://www.asthmacommunitynetwork.org 4. American College of Allergy, Asthma, and Immunology: www.acaai.org      "Like" us  on Facebook and Instagram for our latest updates!      A healthy democracy works best when [employer] participate! Make sure you are registered to vote! If you have moved or changed any of your contact information, you will need to get this updated before voting! Scan the QR codes below to learn more!        Airborne Adult Perc - 03/31/24 1355     Time Antigen Placed 1355    Allergen  Manufacturer Floyd Hutchinson    Location Back    Number of Test 55    1. Control-Buffer 50% Glycerol Negative    2. Control-Histamine 3+    3. Bahia Negative    4. [place] Negative    5. Johnson Negative    6. [place]  Blue Negative    7. Meadow Fescue Negative    8. Perennial Rye Negative    9. Timothy Negative    10. Ragweed Mix Negative    11. Cocklebur Negative    12. Plantain,  English Negative    13. Baccharis Negative    14. Dog Fennel Negative    15. Russian Thistle Negative    16. Lamb's Quarters Negative    17. Sheep Sorrell Negative    18. Rough Pigweed Negative    19. Marsh Elder, Rough Negative    20. Mugwort, Common Negative    21. Box, Elder Negative    22. Cedar, red Negative    23. Sweet Gum Negative    24. Pecan Pollen Negative    25. Pine Mix Negative    26. Walnut, Black Pollen Negative    27. Red Mulberry Negative    28. Ash Mix Negative    29. Birch Mix Negative    30. Beech American Negative    31. Cottonwood, [place] Negative    32. Hickory, White Negative    33. Maple Mix Negative    34. Oak, [place] Mix Negative    35. Sycamore Eastern Negative  36. Alternaria Alternata Negative    37. Cladosporium Herbarum Negative    38. Aspergillus Mix Negative    39. Penicillium Mix Negative    40. Bipolaris Sorokiniana (Helminthosporium) Negative    41. Drechslera Spicifera (Curvularia) Negative    42. Mucor Plumbeus Negative    43. Fusarium Moniliforme Negative    44. Aureobasidium Pullulans (pullulara) Negative    45. Rhizopus Oryzae Negative    46. Botrytis Cinera Negative    47. Epicoccum Nigrum Negative    48. Phoma Betae Negative    49. Dust Mite Mix Negative    50. Cat Hair 10,000 BAU/ml Negative    51.  Dog Epithelia Negative    52. Mixed Feathers Negative    53. Horse Epithelia Negative    54. Cockroach, German Negative    55. Tobacco Leaf Negative             Food Adult Perc - 03/31/24 1300     Time Antigen Placed 1355    Allergen  Manufacturer Floyd Hutchinson    Location Back    Number of allergen test 55             Rhinitis (Hayfever) Overview  There are two types of rhinitis: allergic and non-allergic.  Allergic Rhinitis If you have allergic rhinitis, your immune system mistakenly identifies a typically harmless substance as an intruder. This substance is called an allergen. The immune system responds to the allergen by releasing histamine and chemical mediators that typically cause symptoms in the nose, throat, eyes, ears, skin and roof of the mouth.  Seasonal allergic rhinitis (hay fever) is most often caused by pollen carried in the air during different times of the year in different parts of the country.  Allergic rhinitis can also be triggered by common indoor allergens such as the dried skin flakes, urine and saliva found on pet dander, mold, droppings from dust mites and cockroach particles. This is called perennial allergic rhinitis, as symptoms typically occur year-round.  In addition to allergen triggers, symptoms may also occur from irritants such as smoke and strong odors, or to changes in the temperature and humidity of the air. This happens because allergic rhinitis causes inflammation in the nasal lining, which increases sensitivity to inhalants.  Many people with allergic rhinitis are prone to allergic conjunctivitis (eye allergy). In addition, allergic rhinitis can make symptoms of asthma worse for people who suffer from both conditions.  Nonallergic Rhinitis At least one out of three people with rhinitis symptoms do not have allergies. Nonallergic rhinitis usually afflicts adults and causes year-round symptoms, especially runny nose and nasal congestion. This condition differs from allergic rhinitis because the immune system is not involved.

## 2024-03-31 NOTE — Progress Notes (Signed)
 FOLLOW UP  Date of Service/Encounter:  03/31/24   Assessment:   Bloating    Gastroesophageal reflux disease  Chronic rhinitis - with negative testing to the entire panel   Recurrent infections - with inadequate protection against Streptococcus pneumonia (5/23 protective), recommended getting Pneumovax and postvaccination titers   PTSD and anxiety  Plan/Recommendations:   1. Bloating - Milk and wheat was negative on blood work. - But you could still avoid these foods if you feel better. - This is more of an intolerance rather than an allergy.  2. Gastroesophageal reflux disease - This seems to be better.   3. Recurrent infections - Everything was normal except for your Streptococcal pneumonia titers (only protective to 5 out of 23 serotypes of Streptococcal strains). - The next step is to get the Pneumovax and then get repeat titers in 4-6 weeks.   4. Chronic rhinitis  - Testing today showed: NEGATIVE to the entire panel - Copy of test results provided.  - Continue an antihistamine as needed.   5. Return in about 3 months (around 06/30/2024). You can have the follow up appointment with Dr. Idolina Maker or a Nurse Practicioner (our Nurse Practitioners are excellent and always have Physician oversight!).   Subjective:   Rose Gray is a 28 y.o. female presenting today for follow up of  Chief Complaint  Patient presents with   Allergy Testing    Chirstine P Gray has a history of the following: Patient Active Problem List   Diagnosis Date Noted   Bipolar 1 disorder (HCC) 01/24/2022   BMI 29.0-29.9,adult 01/24/2022   Chronic cough 01/24/2022   History of asthma 01/24/2022   Tinnitus 01/24/2022   Allergic rhinitis due to pollen 03/28/2020   History of tonsillectomy and adenoidectomy 03/17/2019   Chest pain 02/02/2019   Essential hypertension 02/02/2019   SIRS (systemic inflammatory response syndrome) (HCC) 02/01/2019   Adjustment disorder with anxiety  07/22/2018   Chronic tonsillitis 04/25/2018    History obtained from: chart review and patient.  Discussed the use of AI scribe software for clinical note transcription with the patient and/or guardian, who gave verbal consent to proceed.  [name] is a 28 y.o. female presenting for skin testing. She was last seen on April 22. We could not do testing because her insurance company does not cover testing on the same day as a New Patient visit. She has been off of all antihistamines 3 days in anticipation of the testing.   At that visit, we obtained some labs to look for milk and wheat allergies.  We also gave her a food allergy list that she could circle if she wanted to have testing done.  We did an immune workup which showed protection to only 5 out of 23 serotypes of Streptococcus pneumonia, but was otherwise normal.  Otherwise, there have been no changes to her past medical history, surgical history, family history, or social history.    Review of systems otherwise negative other than that mentioned in the HPI.    Objective:   Last menstrual period 03/09/2024, not currently breastfeeding. There is no height or weight on file to calculate BMI.    Physical exam deferred since this was a skin testing appointment only.   Diagnostic studies:   Allergy Studies:     Airborne Adult Perc - 03/31/24 1355     Time Antigen Placed 1355    Allergen Manufacturer Floyd Hutchinson    Location Back    Number of Test 55  1. Control-Buffer 50% Glycerol Negative    2. Control-Histamine 3+    3. Bahia Negative    4. French Southern Territories Negative    5. Johnson Negative    6. [place]  Blue Negative    7. Meadow Fescue Negative    8. Perennial Rye Negative    9. Timothy Negative    10. Ragweed Mix Negative    11. Cocklebur Negative    12. Plantain,  English Negative    13. Baccharis Negative    14. Dog Fennel Negative    15. Russian Thistle Negative    16. Lamb's Quarters Negative    17. Sheep Sorrell  Negative    18. Rough Pigweed Negative    19. Marsh Elder, Rough Negative    20. Mugwort, Common Negative    21. Box, Elder Negative    22. Cedar, red Negative    23. Sweet Gum Negative    24. Pecan Pollen Negative    25. Pine Mix Negative    26. Walnut, Black Pollen Negative    27. Red Mulberry Negative    28. Ash Mix Negative    29. Birch Mix Negative    30. Beech American Negative    31. Cottonwood, Guinea-Bissau Negative    32. Hickory, White Negative    33. Maple Mix Negative    34. Oak, Guinea-[place] Mix Negative    35. Sycamore Eastern Negative    36. Alternaria Alternata Negative    37. Cladosporium Herbarum Negative    38. Aspergillus Mix Negative    39. Penicillium Mix Negative    40. Bipolaris Sorokiniana (Helminthosporium) Negative    41. Drechslera Spicifera (Curvularia) Negative    42. Mucor Plumbeus Negative    43. Fusarium Moniliforme Negative    44. Aureobasidium Pullulans (pullulara) Negative    45. Rhizopus Oryzae Negative    46. Botrytis Cinera Negative    47. Epicoccum Nigrum Negative    48. Phoma Betae Negative    49. Dust Mite Mix Negative    50. Cat Hair 10,000 BAU/ml Negative    51.  Dog Epithelia Negative    52. Mixed Feathers Negative    53. Horse Epithelia Negative    54. Cockroach, German Negative    55. Tobacco Leaf Negative               Allergy testing results were read and interpreted by myself, documented by clinical staff.      Drexel Gentles, MD  [facility] 

## 2024-04-01 ENCOUNTER — Encounter (HOSPITAL_BASED_OUTPATIENT_CLINIC_OR_DEPARTMENT_OTHER): Payer: Self-pay

## 2024-04-01 LAB — URINE CULTURE

## 2024-04-04 LAB — STREP PNEUMONIAE 23 SEROTYPES IGG
Pneumo Ab Type 1*: 0.3 ug/mL — ABNORMAL LOW (ref >1.3)
Pneumo Ab Type 12 (12F)*: <0.1 ug/mL — ABNORMAL LOW (ref >1.3)
Pneumo Ab Type 14*: 1.5 ug/mL (ref >1.3)
Pneumo Ab Type 17 (17F)*: 0.5 ug/mL — ABNORMAL LOW (ref >1.3)
Pneumo Ab Type 19 (19F)*: <0.1 ug/mL — ABNORMAL LOW (ref >1.3)
Pneumo Ab Type 2*: <0.2 ug/mL — ABNORMAL LOW (ref >1.3)
Pneumo Ab Type 20*: <0.2 ug/mL — ABNORMAL LOW (ref >1.3)
Pneumo Ab Type 22 (22F)*: 2.8 ug/mL (ref >1.3)
Pneumo Ab Type 23 (23F)*: 8.4 ug/mL (ref >1.3)
Pneumo Ab Type 26 (6B)*: <0.1 ug/mL — ABNORMAL LOW (ref >1.3)
Pneumo Ab Type 3*: <0.1 ug/mL — ABNORMAL LOW (ref >1.3)
Pneumo Ab Type 34 (10A)*: <0.1 ug/mL — ABNORMAL LOW (ref >1.3)
Pneumo Ab Type 4*: <0.1 ug/mL — ABNORMAL LOW (ref >1.3)
Pneumo Ab Type 43 (11A)*: <0.1 ug/mL — ABNORMAL LOW (ref >1.3)
Pneumo Ab Type 5*: 0.4 ug/mL — ABNORMAL LOW (ref >1.3)
Pneumo Ab Type 51 (7F)*: <0.1 ug/mL — ABNORMAL LOW (ref >1.3)
Pneumo Ab Type 54 (15B)*: 2.4 ug/mL (ref >1.3)
Pneumo Ab Type 56 (18C)*: 0.2 ug/mL — ABNORMAL LOW (ref >1.3)
Pneumo Ab Type 57 (19A)*: <0.1 ug/mL — ABNORMAL LOW (ref >1.3)
Pneumo Ab Type 68 (9V)*: <0.1 ug/mL — ABNORMAL LOW (ref >1.3)
Pneumo Ab Type 70 (33F)*: 2.6 ug/mL (ref >1.3)
Pneumo Ab Type 8*: <0.3 ug/mL — ABNORMAL LOW (ref >1.3)
Pneumo Ab Type 9 (9N)*: <0.1 ug/mL — ABNORMAL LOW (ref >1.3)

## 2024-04-04 LAB — CBC WITH DIFF/PLATELET
Basophils Absolute: 0.1 10*3/uL (ref 0.0–0.2)
Basos: 1 %
EOS (ABSOLUTE): 0.1 10*3/uL (ref 0.0–0.4)
Eos: 1 %
Hematocrit: 44.1 % (ref 34.0–46.6)
Hemoglobin: 15.1 g/dL (ref 11.1–15.9)
Immature Grans (Abs): 0 10*3/uL (ref 0.0–0.1)
Immature Granulocytes: 0 %
Lymphocytes Absolute: 2.1 10*3/uL (ref 0.7–3.1)
Lymphs: 30 %
MCH: 32.1 pg (ref 26.6–33.0)
MCHC: 34.2 g/dL (ref 31.5–35.7)
MCV: 94 fL (ref 79–97)
Monocytes Absolute: 0.5 10*3/uL (ref 0.1–0.9)
Monocytes: 7 %
Neutrophils Absolute: 4.2 10*3/uL (ref 1.4–7.0)
Neutrophils: 61 %
Platelets: 365 10*3/uL (ref 150–450)
RBC: 4.7 x10E6/uL (ref 3.77–5.28)
RDW: 11.8 % (ref 11.7–15.4)
WBC: 6.8 10*3/uL (ref 3.4–10.8)

## 2024-04-04 LAB — IGG, IGA, IGM
IgA/Immunoglobulin A, Serum: 95 mg/dL (ref 87–352)
IgG (Immunoglobin G), Serum: 823 mg/dL (ref 586–1602)
IgM (Immunoglobulin M), Srm: 106 mg/dL (ref 26–217)

## 2024-04-04 LAB — DIPHTHERIA / TETANUS ANTIBODY PANEL
Diphtheria Ab: 0.37 [IU]/mL (ref <0.10)
Tetanus Ab, IgG: 1.73 [IU]/mL (ref <0.10)

## 2024-04-04 LAB — ALLERGEN, WHEAT, F4: Wheat IgE: <0.1 kU/L

## 2024-04-04 LAB — MILK COMPONENT PANEL
F076-IgE Alpha Lactalbumin: <0.1 kU/L
F077-IgE Beta Lactoglobulin: <0.1 kU/L
F078-IgE Casein: <0.1 kU/L

## 2024-04-04 LAB — COMPLEMENT, TOTAL: Compl, Total (CH50): >60 U/mL (ref >41)

## 2024-04-04 LAB — TRYPTASE: Tryptase: 4.9 ug/L (ref 2.2–13.2)

## 2024-04-08 ENCOUNTER — Encounter: Payer: Self-pay | Admitting: Allergy & Immunology

## 2024-04-13 ENCOUNTER — Ambulatory Visit

## 2024-04-13 DIAGNOSIS — Z419 Encounter for procedure for purposes other than remedying health state, unspecified: Secondary | ICD-10-CM | POA: Diagnosis not present

## 2024-05-01 DIAGNOSIS — F411 Generalized anxiety disorder: Secondary | ICD-10-CM | POA: Diagnosis not present

## 2024-05-01 DIAGNOSIS — F900 Attention-deficit hyperactivity disorder, predominantly inattentive type: Secondary | ICD-10-CM | POA: Diagnosis not present

## 2024-05-01 DIAGNOSIS — F4310 Post-traumatic stress disorder, unspecified: Secondary | ICD-10-CM | POA: Diagnosis not present

## 2024-05-01 DIAGNOSIS — F3132 Bipolar disorder, current episode depressed, moderate: Secondary | ICD-10-CM | POA: Diagnosis not present

## 2024-05-14 DIAGNOSIS — Z419 Encounter for procedure for purposes other than remedying health state, unspecified: Secondary | ICD-10-CM | POA: Diagnosis not present

## 2024-05-28 ENCOUNTER — Ambulatory Visit: Payer: Medicaid Other | Admitting: Family Medicine

## 2024-05-28 VITALS — BP 122/80 | HR 79 | Temp 97.9°F | Ht 67.0 in | Wt 175.0 lb

## 2024-05-28 DIAGNOSIS — Z0001 Encounter for general adult medical examination with abnormal findings: Secondary | ICD-10-CM | POA: Diagnosis not present

## 2024-05-28 DIAGNOSIS — Z0000 Encounter for general adult medical examination without abnormal findings: Secondary | ICD-10-CM

## 2024-05-28 DIAGNOSIS — E663 Overweight: Secondary | ICD-10-CM | POA: Diagnosis not present

## 2024-05-28 DIAGNOSIS — L659 Nonscarring hair loss, unspecified: Secondary | ICD-10-CM

## 2024-05-28 LAB — CBC WITH DIFFERENTIAL/PLATELET
Basophils Absolute: 0.1 10*3/uL (ref 0.0–0.1)
Basophils Relative: 1.3 % (ref 0.0–3.0)
Eosinophils Absolute: 0.1 10*3/uL (ref 0.0–0.7)
Eosinophils Relative: 1.5 % (ref 0.0–5.0)
HCT: 42 % (ref 36.0–46.0)
Hemoglobin: 14.4 g/dL (ref 12.0–15.0)
Lymphocytes Relative: 32.4 % (ref 12.0–46.0)
Lymphs Abs: 1.9 10*3/uL (ref 0.7–4.0)
MCHC: 34.2 g/dL (ref 30.0–36.0)
MCV: 93.4 fl (ref 78.0–100.0)
Monocytes Absolute: 0.4 10*3/uL (ref 0.1–1.0)
Monocytes Relative: 7.1 % (ref 3.0–12.0)
Neutro Abs: 3.4 10*3/uL (ref 1.4–7.7)
Neutrophils Relative %: 57.7 % (ref 43.0–77.0)
Platelets: 338 10*3/uL (ref 150.0–400.0)
RBC: 4.5 Mil/uL (ref 3.87–5.11)
RDW: 12.8 % (ref 11.5–15.5)
WBC: 5.9 10*3/uL (ref 4.0–10.5)

## 2024-05-28 LAB — COMPREHENSIVE METABOLIC PANEL WITH GFR
ALT: 13 U/L (ref 0–35)
AST: 12 U/L (ref 0–37)
Albumin: 4.6 g/dL (ref 3.5–5.2)
Alkaline Phosphatase: 56 U/L (ref 39–117)
BUN: 10 mg/dL (ref 6–23)
CO2: 28 meq/L (ref 19–32)
Calcium: 9.6 mg/dL (ref 8.4–10.5)
Chloride: 103 meq/L (ref 96–112)
Creatinine, Ser: 0.94 mg/dL (ref 0.40–1.20)
GFR: 83.49 mL/min (ref >60.00)
Glucose, Bld: 89 mg/dL (ref 70–99)
Potassium: 3.9 meq/L (ref 3.5–5.1)
Sodium: 139 meq/L (ref 135–145)
Total Bilirubin: 0.5 mg/dL (ref 0.2–1.2)
Total Protein: 7.1 g/dL (ref 6.0–8.3)

## 2024-05-28 LAB — LIPID PANEL
Cholesterol: 136 mg/dL (ref 0–200)
HDL: 62.9 mg/dL (ref >39.00)
LDL Cholesterol: 59 mg/dL (ref 0–99)
NonHDL: 72.9
Total CHOL/HDL Ratio: 2
Triglycerides: 71 mg/dL (ref 0.0–149.0)
VLDL: 14.2 mg/dL (ref 0.0–40.0)

## 2024-05-28 LAB — VITAMIN B12: Vitamin B-12: 421 pg/mL (ref 211–911)

## 2024-05-28 LAB — VITAMIN D 25 HYDROXY (VIT D DEFICIENCY, FRACTURES): VITD: 34.67 ng/mL (ref 30.00–100.00)

## 2024-05-28 LAB — TSH: TSH: 2.58 u[IU]/mL (ref 0.35–5.50)

## 2024-05-28 LAB — HEMOGLOBIN A1C: Hgb A1c MFr Bld: 5.4 % (ref 4.6–6.5)

## 2024-05-28 NOTE — Patient Instructions (Signed)
-  Physical exam completed today. -Recommend a healthy diet and participating in regular exercise. -Ordered labs based on BMI-overweight and complaints of hair loss. Office will call with results and will be available on MyChart.  -Follow up in 1 year for a physical.

## 2024-05-28 NOTE — Progress Notes (Signed)
 Complete physical exam  Patient: Danni P Sforza   DOB: Feb 23, 1996   27 y.o. Female  MRN: [id number]  Subjective:    Chief Complaint  Patient presents with   Annual Exam    Demani P Mclamb is a 28 y.o. female who presents today for a complete physical exam. She reports consuming a genera diet. For the last year, been on a weight loss journey. Intermittent will eat healthy for a while, then not do as well.  Exercise: Generally does 5 days a week of cardio-walking and weight training at home.  She generally feels fairly well. She reports sleeping well. She does not have additional problems to discuss today.    Most recent fall risk assessment:    12/09/2023    1:28 PM  Fall Risk   Falls in the past year? 0  Number falls in past yr: 0  Injury with Fall? 0     Most recent depression screenings:    05/28/2024   11:08 AM 03/30/2024    3:02 PM  PHQ 2/9 Scores  PHQ - 2 Score 0 0  PHQ- 9 Score 0     Vision:Within last year and Dental: No current dental problems and No regular dental care   Past Medical History:  Diagnosis Date   Anxiety    Bipolar 2 disorder (HCC)    Depression    GERD (gastroesophageal reflux disease)    Hypertension    Past Surgical History:  Procedure Laterality Date   CESAREAN SECTION  2023   NO PAST SURGERIES     TONSILLECTOMY N/A 04/25/2018   Procedure: TONSILLECTOMY;  Surgeon: Carlie Clark, MD;  Location: [facility] OR;  Service: ENT;  Laterality: N/A;   TYMPANOSTOMY TUBE PLACEMENT     Social History   Tobacco Use   Smoking status: Never    Passive exposure: Yes   Smokeless tobacco: Never  Vaping Use   Vaping status: Never Used  Substance Use Topics   Alcohol use: No   Drug use: Never   Social History   Socioeconomic History   Marital status: Single    Spouse name: Not on file   Number of children: 1   Years of education: Not on file   Highest education level: GED or equivalent  Occupational History   Not on file  Tobacco Use    Smoking status: Never    Passive exposure: Yes   Smokeless tobacco: Never  Vaping Use   Vaping status: Never Used  Substance and Sexual Activity   Alcohol use: No   Drug use: Never   Sexual activity: Yes    Birth control/protection: None  Other Topics Concern   Not on file  Social History Narrative   Not on file   Social Drivers of Health   Financial Resource Strain: Low Risk  (05/24/2024)   Overall Financial Resource Strain (CARDIA)    Difficulty of Paying Living Expenses: Not hard at all  Food Insecurity: No Food Insecurity (05/24/2024)   Hunger Vital Sign    Worried About Running Out of Food in the Last Year: Never true    Ran Out of Food in the Last Year: Never true  Transportation Needs: No Transportation Needs (05/24/2024)   PRAPARE - Administrator, Civil Service (Medical): No    Lack of Transportation (Non-Medical): No  Physical Activity: Sufficiently Active (05/24/2024)   Exercise Vital Sign    Days of Exercise per Week: 5 days    Minutes of Exercise  per Session: 40 min  Stress: No Stress Concern Present (05/24/2024)   [employer] of Occupational Health - Occupational Stress Questionnaire    Feeling of Stress: Not at all  Social Connections: Moderately Isolated (05/24/2024)   Social Connection and Isolation Panel    Frequency of Communication with Friends and Family: More than three times a week    Frequency of Social Gatherings with Friends and Family: Twice a week    Attends Religious Services: Never    Database administrator or Organizations: No    Attends Engineer, structural: Not on file    Marital Status: Living with partner  Intimate Partner Violence: Not At Risk (05/27/2023)   Humiliation, Afraid, Rape, and Kick questionnaire    Fear of Current or Ex-Partner: No    Emotionally Abused: No    Physically Abused: No    Sexually Abused: No   Family Status  Relation Name Status   Mother  Alive   Father  Alive   Sister  Alive    Brother  Alive   MGM  Alive   MGF  Deceased   PGM  Deceased   PGF  Alive   Son  Alive   Neg Hx  (Not Specified)  No partnership data on file   Family History  Problem Relation Age of Onset   Diverticulitis Mother    Kidney failure Mother    Anxiety disorder Mother    Bipolar disorder Mother    Depression Mother    Urticaria Father    Allergic rhinitis Father    Heart attack Father    Ulcerative colitis Father    COPD Father    Urticaria Sister    Colon polyps Sister    Anxiety disorder Sister    Depression Sister    Bipolar disorder Sister    ADD / ADHD Brother    Hypertension Brother    Cancer Maternal Grandmother        Breast cancer   Diabetes Mellitus II Maternal Grandmother    Colon polyps Paternal Grandmother    Colon cancer Neg Hx    Esophageal cancer Neg Hx    Rectal cancer Neg Hx    Stomach cancer Neg Hx    Allergies  Allergen Reactions   Vancomycin  Itching and Other (See Comments)    Became flushed in the face and itching and redness on scalp     Patient Care Team: Billy Philippe SAUNDERS, NP as PCP - General (Family Medicine) Iva Marty Saltness, MD as Consulting Physician (Allergy  and Immunology) Tad, Arland POUR, CNM as Midwife (Certified Nurse Midwife)   Outpatient Medications Prior to Visit  Medication Sig   vitamin B-12 (CYANOCOBALAMIN ) 100 MCG tablet Take 100 mcg by mouth daily. (Patient not taking: Reported on 05/28/2024)   Vitamin D , Ergocalciferol , (DRISDOL ) 1.25 MG (50000 UNIT) CAPS capsule Take 1 capsule (50,000 Units total) by mouth every 7 (seven) days. (Patient not taking: Reported on 05/28/2024)   [DISCONTINUED] amphetamine-dextroamphetamine (ADDERALL XR) 10 MG 24 hr capsule Take 10 mg by mouth every morning. (Patient not taking: Reported on 05/28/2024)   [DISCONTINUED] nitrofurantoin , macrocrystal-monohydrate, (MACROBID ) 100 MG capsule Take 1 capsule (100 mg total) by mouth 2 (two) times daily.   [DISCONTINUED] nystatin  cream (MYCOSTATIN ) Apply  thin layer after each breastfeeding or pumping session, Continue to use for 1-2 weeks after all symptoms have resolved.   [DISCONTINUED] triamcinolone  cream (KENALOG ) 0.1 % Apply 1 Application topically 2 (two) times daily.   No facility-administered medications  prior to visit.    ROS See HPI above       Objective:   BP 122/80   Pulse 79   Temp 97.9 F (36.6 C) (Oral)   Ht 5' 7 (1.702 m)   Wt 175 lb (79.4 kg)   LMP 05/07/2024 (Exact Date)   SpO2 99%   Breastfeeding No   BMI 27.41 kg/m    Physical Exam Vitals reviewed.  Constitutional:      General: She is not in acute distress.    Appearance: Normal appearance. She is overweight. She is not ill-appearing or toxic-appearing.  HENT:     Head: Normocephalic and atraumatic.     Right Ear: Tympanic membrane, ear canal and external ear normal. There is no impacted cerumen.     Left Ear: Tympanic membrane, ear canal and external ear normal. There is no impacted cerumen.     Nose:     Right Sinus: No maxillary sinus tenderness or frontal sinus tenderness.     Left Sinus: No maxillary sinus tenderness or frontal sinus tenderness.     Mouth/Throat:     Mouth: Mucous membranes are moist.     Pharynx: Oropharynx is clear. Uvula midline. No pharyngeal swelling, oropharyngeal exudate, posterior oropharyngeal erythema, uvula swelling or postnasal drip.   Eyes:     General:        Right eye: No discharge.        Left eye: No discharge.     Conjunctiva/sclera: Conjunctivae normal.     Pupils: Pupils are equal, round, and reactive to light.   Neck:     Thyroid: No thyromegaly.   Cardiovascular:     Rate and Rhythm: Normal rate and regular rhythm.     Pulses:          Dorsalis pedis pulses are 2+ on the right side and 2+ on the left side.     Heart sounds: Normal heart sounds. No murmur heard.    No friction rub. No gallop.  Pulmonary:     Effort: Pulmonary effort is normal. No respiratory distress.     Breath sounds: Normal  breath sounds.  Abdominal:     General: Abdomen is flat. Bowel sounds are normal. There is no distension.     Palpations: Abdomen is soft. There is no mass.     Tenderness: There is no abdominal tenderness. There is no right CVA tenderness or left CVA tenderness.   Musculoskeletal:        General: Normal range of motion.     Cervical back: Normal range of motion.     Right lower leg: No edema.     Left lower leg: No edema.  Lymphadenopathy:     Cervical: No cervical adenopathy.   Skin:    General: Skin is warm and dry.     Comments: Right second toenail is black.    Neurological:     General: No focal deficit present.     Mental Status: She is alert and oriented to person, place, and time. Mental status is at baseline.     Motor: No weakness.     Gait: Gait normal.   Psychiatric:        Mood and Affect: Mood normal.        Behavior: Behavior normal.        Thought Content: Thought content normal.        Judgment: Judgment normal.        Assessment & Plan:  Routine Health Maintenance and Physical Exam  Immunization History  Administered Date(s) Administered   DTaP 09/09/1997, 11/02/1997, 12/23/1997, 11/03/1998, 08/12/2002   HIB (PRP-OMP) 09/09/1997, 11/02/1997, 11/03/1998   HIB, Unspecified 09/09/1997, 11/02/1997, 11/03/1998   HPV 9-valent 07/24/2019   Hepatitis B, PED/ADOLESCENT 02/02/97, 07/19/1997, 09/08/1998   IPV 09/09/1997, 11/02/1997, 09/05/1998, 08/12/2002   MMR 09/05/1998, 08/12/2002   Tdap 07/24/2019, 06/14/2022   Varicella 09/05/1998    Health Maintenance  Topic Date Due   COVID-19 Vaccine (1) Never done   Pneumococcal Vaccine 59-31 Years old (1 of 2 - PCV) Never done   HPV VACCINES (2 - 3-dose series) 08/21/2019   INFLUENZA VACCINE  07/03/2024   Cervical Cancer Screening (Pap smear)  10/23/2025   DTaP/Tdap/Td (8 - Td or Tdap) 06/14/2032   Hepatitis B Vaccines  Completed   Hepatitis C Screening  Completed   HIV Screening  Completed    Meningococcal B Vaccine  Aged Out    Discussed health benefits of physical activity, and encouraged her to engage in regular exercise appropriate for her age and condition.  Annual physical exam  Hair loss -     CBC with Differential/Platelet -     Iron, TIBC and Ferritin Panel -     TSH -     VITAMIN D  25 Hydroxy (Vit-D Deficiency, Fractures) -     Vitamin B12 -     Zinc  Overweight (BMI 25.0-29.9) -     CBC with Differential/Platelet -     Comprehensive metabolic panel with GFR -     Hemoglobin A1c -     Lipid panel -     TSH   1.Review health maintenance: -Covid vaccine: Declines  -HPV vaccine: Will discuss with HPV  -PNA vaccine: Not had  2.Physical exam completed today. 3.Recommend a healthy diet and participating in regular exercise. 4.Ordered labs based on BMI-overweight and complaints of hair loss. Office will call with results and will be available on MyChart.  Return in about 1 year (around 05/28/2025) for physical.    Valbona Slabach, NP

## 2024-05-29 ENCOUNTER — Ambulatory Visit: Payer: Self-pay | Admitting: Family Medicine

## 2024-06-06 LAB — IRON,TIBC AND FERRITIN PANEL
%SAT: 13 % — ABNORMAL LOW (ref 16–45)
Ferritin: 17 ng/mL (ref 16–154)
Iron: 53 ug/dL (ref 40–190)
TIBC: 416 ug/dL (ref 250–450)

## 2024-06-06 LAB — EXTRA SPECIMEN

## 2024-06-06 LAB — ZINC: Zinc: 74 ug/dL (ref 60–130)

## 2024-06-13 DIAGNOSIS — Z419 Encounter for procedure for purposes other than remedying health state, unspecified: Secondary | ICD-10-CM | POA: Diagnosis not present

## 2024-06-24 NOTE — Progress Notes (Unsigned)
   [address] [place] [address] Dept: [phone]  FOLLOW UP NOTE  Patient ID: Rose Gray, female    DOB: 12/16/1995  Age: 28 y.o. MRN: [id number] Date of Office Visit: 06/25/2024  Assessment  Chief Complaint: No chief complaint on file.  HPI Rose Gray is a 28 year old female who presents to clinic for follow-up visit.  She was last seen in this clinic on 03/31/2024 by Dr. Iva for evaluation of chronic rhinitis, reflux, bloating with negative food testing to milk and wheat, and recurrent infection.  Her last environmental allergy  skin testing on 03/31/2024 was negative to the environmental panel.  Her last lab testing to selected foods on 03/24/2024 was negative to milk and wheat.  Lab testing on 03/24/2024 indicated normal immunoglobulins, protective titers to tetanus and diphtheria, and 5 out of 23 protective pneumococcal titers.  PPSV23 was recommended at that time.  Discussed the use of AI scribe software for clinical note transcription with the patient, who gave verbal consent to proceed.  History of Present Illness      Drug Allergies:  Allergies  Allergen Reactions   Vancomycin  Itching and Other (See Comments)    Became flushed in the face and itching and redness on scalp    Physical Exam: LMP 05/07/2024 (Exact Date)    Physical Exam  Diagnostics:    Assessment and Plan: No diagnosis found.  No orders of the defined types were placed in this encounter.   There are no Patient Instructions on file for this visit.  No follow-ups on file.    Thank you for the opportunity to care for this patient.  Please do not hesitate to contact me with questions.  Arlean Mutter, FNP [facility]

## 2024-06-24 NOTE — Patient Instructions (Signed)
 Bloating Your testing to milk and wheat was negative.  Continue to avoid milk and wheat if they bother your digestive system.  This is likely an intolerance.  Reflux Continue dietary and lifestyle modifications as listed below  Chronic rhinitis Consider saline nasal rinses as needed for nasal symptoms. Use this before any medicated nasal sprays for best result  Recurrent infection Keep track of infections, antibiotics, and steroid use Once you receive the PPSV23 vaccine we will recheck pneumococcal titers via lab test  Call the clinic if this treatment plan is not working well for you.  Follow up in *** or sooner if needed.

## 2024-06-25 ENCOUNTER — Encounter: Payer: Self-pay | Admitting: Family Medicine

## 2024-06-25 ENCOUNTER — Other Ambulatory Visit: Payer: Self-pay

## 2024-06-25 ENCOUNTER — Ambulatory Visit: Admitting: Family Medicine

## 2024-06-25 VITALS — BP 114/72 | HR 95 | Temp 98.3°F | Resp 16 | Ht 66.73 in | Wt 175.5 lb

## 2024-06-25 DIAGNOSIS — K219 Gastro-esophageal reflux disease without esophagitis: Secondary | ICD-10-CM | POA: Diagnosis not present

## 2024-06-25 DIAGNOSIS — R140 Abdominal distension (gaseous): Secondary | ICD-10-CM | POA: Diagnosis not present

## 2024-06-25 DIAGNOSIS — B999 Unspecified infectious disease: Secondary | ICD-10-CM

## 2024-06-25 DIAGNOSIS — J310 Chronic rhinitis: Secondary | ICD-10-CM | POA: Diagnosis not present

## 2024-06-26 ENCOUNTER — Encounter: Payer: Self-pay | Admitting: Family Medicine

## 2024-06-26 DIAGNOSIS — R140 Abdominal distension (gaseous): Secondary | ICD-10-CM | POA: Insufficient documentation

## 2024-06-26 DIAGNOSIS — J310 Chronic rhinitis: Secondary | ICD-10-CM | POA: Insufficient documentation

## 2024-06-26 DIAGNOSIS — B999 Unspecified infectious disease: Secondary | ICD-10-CM | POA: Insufficient documentation

## 2024-06-26 DIAGNOSIS — K219 Gastro-esophageal reflux disease without esophagitis: Secondary | ICD-10-CM | POA: Insufficient documentation

## 2024-07-14 DIAGNOSIS — Z419 Encounter for procedure for purposes other than remedying health state, unspecified: Secondary | ICD-10-CM | POA: Diagnosis not present

## 2024-08-14 DIAGNOSIS — Z419 Encounter for procedure for purposes other than remedying health state, unspecified: Secondary | ICD-10-CM | POA: Diagnosis not present

## 2024-09-14 ENCOUNTER — Other Ambulatory Visit (HOSPITAL_BASED_OUTPATIENT_CLINIC_OR_DEPARTMENT_OTHER): Payer: Self-pay | Admitting: Certified Nurse Midwife

## 2024-09-14 ENCOUNTER — Encounter (HOSPITAL_BASED_OUTPATIENT_CLINIC_OR_DEPARTMENT_OTHER): Payer: Self-pay | Admitting: Certified Nurse Midwife

## 2024-09-14 DIAGNOSIS — M5431 Sciatica, right side: Secondary | ICD-10-CM | POA: Diagnosis not present

## 2024-09-14 DIAGNOSIS — R1021 Pelvic and perineal pain right side: Secondary | ICD-10-CM

## 2024-09-15 ENCOUNTER — Ambulatory Visit (HOSPITAL_BASED_OUTPATIENT_CLINIC_OR_DEPARTMENT_OTHER)

## 2024-09-30 ENCOUNTER — Other Ambulatory Visit (HOSPITAL_BASED_OUTPATIENT_CLINIC_OR_DEPARTMENT_OTHER)

## 2024-10-11 DIAGNOSIS — J019 Acute sinusitis, unspecified: Secondary | ICD-10-CM | POA: Diagnosis not present

## 2024-10-11 DIAGNOSIS — R051 Acute cough: Secondary | ICD-10-CM | POA: Diagnosis not present

## 2024-10-11 DIAGNOSIS — Z20822 Contact with and (suspected) exposure to covid-19: Secondary | ICD-10-CM | POA: Diagnosis not present

## 2024-10-11 DIAGNOSIS — J029 Acute pharyngitis, unspecified: Secondary | ICD-10-CM | POA: Diagnosis not present

## 2024-10-11 DIAGNOSIS — B9689 Other specified bacterial agents as the cause of diseases classified elsewhere: Secondary | ICD-10-CM | POA: Diagnosis not present

## 2024-10-14 DIAGNOSIS — Z419 Encounter for procedure for purposes other than remedying health state, unspecified: Secondary | ICD-10-CM | POA: Diagnosis not present

## 2024-10-27 DIAGNOSIS — L659 Nonscarring hair loss, unspecified: Secondary | ICD-10-CM | POA: Diagnosis not present

## 2024-10-27 DIAGNOSIS — R632 Polyphagia: Secondary | ICD-10-CM | POA: Diagnosis not present

## 2024-10-27 DIAGNOSIS — Z0000 Encounter for general adult medical examination without abnormal findings: Secondary | ICD-10-CM | POA: Diagnosis not present

## 2024-10-27 DIAGNOSIS — Z136 Encounter for screening for cardiovascular disorders: Secondary | ICD-10-CM | POA: Diagnosis not present

## 2024-10-27 DIAGNOSIS — E611 Iron deficiency: Secondary | ICD-10-CM | POA: Diagnosis not present

## 2024-10-27 DIAGNOSIS — Z13220 Encounter for screening for lipoid disorders: Secondary | ICD-10-CM | POA: Diagnosis not present

## 2024-10-27 DIAGNOSIS — E559 Vitamin D deficiency, unspecified: Secondary | ICD-10-CM | POA: Diagnosis not present

## 2024-11-13 DIAGNOSIS — Z419 Encounter for procedure for purposes other than remedying health state, unspecified: Secondary | ICD-10-CM | POA: Diagnosis not present

## 2024-11-21 DIAGNOSIS — W268XXA Contact with other sharp object(s), not elsewhere classified, initial encounter: Secondary | ICD-10-CM | POA: Diagnosis not present

## 2024-11-21 DIAGNOSIS — S61111A Laceration without foreign body of right thumb with damage to nail, initial encounter: Secondary | ICD-10-CM | POA: Diagnosis not present

## 2024-12-08 ENCOUNTER — Ambulatory Visit (HOSPITAL_BASED_OUTPATIENT_CLINIC_OR_DEPARTMENT_OTHER): Payer: Medicaid Other | Admitting: Certified Nurse Midwife

## 2024-12-11 ENCOUNTER — Ambulatory Visit
Admission: EM | Admit: 2024-12-11 | Discharge: 2024-12-11 | Disposition: A | Discharge status: Home / Self Care | Attending: Internal Medicine | Admitting: Internal Medicine

## 2024-12-11 ENCOUNTER — Encounter: Payer: Self-pay | Admitting: Emergency Medicine

## 2024-12-11 DIAGNOSIS — S61111S Laceration without foreign body of right thumb with damage to nail, sequela: Secondary | ICD-10-CM | POA: Diagnosis not present

## 2024-12-11 DIAGNOSIS — S61111D Laceration without foreign body of right thumb with damage to nail, subsequent encounter: Secondary | ICD-10-CM | POA: Diagnosis not present

## 2024-12-11 MED ORDER — MUPIROCIN 2 % EX OINT: 1.0000 | TOPICAL_OINTMENT | Freq: Two times a day (BID) | CUTANEOUS | 1 refills | Status: AC

## 2024-12-11 NOTE — ED Triage Notes (Signed)
 Patient states that she had a laceration to her right thumb on 11/21/2024.  Patient had sutures placed and was instructed that they were dissolvable and if they had not fallen out in 2 weeks, to have them removed.  Patient eventually had sutures removed by a friend and it seemed to be healing well.  Today her toddler bumped into her finger and opened the wound again.  Tdap 2023.

## 2024-12-11 NOTE — ED Provider Notes (Signed)
 " [name]    CSN: [id number] Arrival date & time: 12/11/24  1519      History   Chief Complaint Chief Complaint  Patient presents with   Finger Injury    HPI Rose Gray is a 29 y.o. female.   29 year old female presents urgent care with complaints of right thumb laceration complications.  The patient had a laceration repair of the distal part of her right thumb done on 12/20.  This included repair through the thumbnail.  The sutures were removed after 2 weeks.  She reports that today her toddler bumped her thumb and she noticed that the end of the nail as well as part of the laceration had pulled apart.  She denies any fevers or chills.     Past Medical History:  Diagnosis Date   Anxiety    Bipolar 2 disorder (HCC)    Depression    GERD (gastroesophageal reflux disease)    Hypertension    Recurrent infections 06/26/2024    Patient Active Problem List   Diagnosis Date Noted   Recurrent infections 06/26/2024   Gastroesophageal reflux disease 06/26/2024   Bloating 06/26/2024   Chronic rhinitis 06/26/2024   Bipolar 1 disorder (HCC) 01/24/2022   BMI 29.0-29.9,adult 01/24/2022   Chronic cough 01/24/2022   History of asthma 01/24/2022   Tinnitus 01/24/2022   Allergic rhinitis due to pollen 03/28/2020   History of tonsillectomy and adenoidectomy 03/17/2019   Chest pain 02/02/2019   Essential hypertension 02/02/2019   SIRS (systemic inflammatory response syndrome) (HCC) 02/01/2019   Adjustment disorder with anxiety 07/22/2018   Chronic tonsillitis 04/25/2018    Past Surgical History:  Procedure Laterality Date   CESAREAN SECTION  2023   NO PAST SURGERIES     TONSILLECTOMY N/A 04/25/2018   Procedure: TONSILLECTOMY;  Surgeon: Carlie Clark, MD;  Location: [facility] OR;  Service: ENT;  Laterality: N/A;   TYMPANOSTOMY TUBE PLACEMENT      OB History     Gravida  1   Para  1   Term  1   Preterm      AB      Living  1      SAB      IAB       Ectopic      Multiple      Live Births  1            Home Medications    Prior to Admission medications  Medication Sig Start Date End Date Taking? Authorizing Provider  mupirocin  ointment (BACTROBAN ) 2 % Apply 1 Application topically 2 (two) times daily. 12/11/24  Yes Aziyah Provencal A, PA-C  Ferrous Sulfate (IRON) 325 (65 Fe) MG TABS Take 1 tablet by mouth daily.    [provider]  vitamin B-12 (CYANOCOBALAMIN ) 100 MCG tablet Take 100 mcg by mouth daily.    [provider]  Vitamin D , Ergocalciferol , (DRISDOL ) 1.25 MG (50000 UNIT) CAPS capsule Take 1 capsule (50,000 Units total) by mouth every 7 (seven) days. 12/09/23   Lo, Arland POUR, CNM    Family History Family History  Problem Relation Age of Onset   Diverticulitis Mother    Kidney failure Mother    Anxiety disorder Mother    Bipolar disorder Mother    Depression Mother    Urticaria Father    Allergic rhinitis Father    Heart attack Father    Ulcerative colitis Father    COPD Father    Urticaria Sister  Colon polyps Sister    Anxiety disorder Sister    Depression Sister    Bipolar disorder Sister    ADD / ADHD Brother    Hypertension Brother    Cancer Maternal Grandmother        Breast cancer   Diabetes Mellitus II Maternal Grandmother    Colon polyps Paternal Grandmother    Colon cancer Neg Hx    Esophageal cancer Neg Hx    Rectal cancer Neg Hx    Stomach cancer Neg Hx     Social History Social History[1]   Allergies   Vancomycin    Review of Systems Review of Systems  Constitutional:  Negative for chills and fever.  HENT:  Negative for ear pain and sore throat.   Eyes:  Negative for pain and visual disturbance.  Respiratory:  Negative for cough and shortness of breath.   Cardiovascular:  Negative for chest pain and palpitations.  Gastrointestinal:  Negative for abdominal pain and vomiting.  Genitourinary:  Negative for dysuria and hematuria.  Musculoskeletal:  Negative for  arthralgias and back pain.  Skin:  Positive for wound. Negative for color change and rash.  Neurological:  Negative for seizures and syncope.  All other systems reviewed and are negative.    Physical Exam Triage Vital Signs ED Triage Vitals  Encounter Vitals Group     BP 12/11/24 1613 130/89     Girls Systolic BP Percentile --      Girls Diastolic BP Percentile --      Boys Systolic BP Percentile --      Boys Diastolic BP Percentile --      Pulse Rate 12/11/24 1613 92     Resp 12/11/24 1613 18     Temp 12/11/24 1613 99.7 F (37.6 C)     Temp Source 12/11/24 1613 Oral     SpO2 12/11/24 1613 96 %     Weight 12/11/24 1612 167 lb (75.8 kg)     Height 12/11/24 1612 5' 7 (1.702 m)     Head Circumference --      Peak Flow --      Pain Score 12/11/24 1612 0     Pain Loc --      Pain Education --      Exclude from Growth Chart --    No data found.  Updated Vital Signs BP 130/89 (BP Location: Right Arm)   Pulse 92   Temp 99.7 F (37.6 C) (Oral)   Resp 18   Ht 5' 7 (1.702 m)   Wt 167 lb (75.8 kg)   LMP 12/09/2024   SpO2 96%   BMI 26.16 kg/m   Visual Acuity Right Eye Distance:   Left Eye Distance:   Bilateral Distance:    Right Eye Near:   Left Eye Near:    Bilateral Near:     Physical Exam Vitals and nursing note reviewed.  Constitutional:      General: She is not in acute distress.    Appearance: She is well-developed.  HENT:     Head: Normocephalic and atraumatic.  Eyes:     Conjunctiva/sclera: Conjunctivae normal.  Cardiovascular:     Rate and Rhythm: Normal rate and regular rhythm.  Pulmonary:     Effort: Pulmonary effort is normal. No respiratory distress.  Musculoskeletal:        General: No swelling.     Cervical back: Neck supple.  Skin:    General: Skin is warm and dry.  Capillary Refill: Capillary refill takes less than 2 seconds.     Comments: Distal part of the right thumb with previous flap repair, flap is now devitalized, part of the  nail is removing from the edges well.  Neurological:     Mental Status: She is alert.  Psychiatric:        Mood and Affect: Mood normal.      UC Treatments / Results  Labs (all labs ordered are listed, but only abnormal results are displayed) Labs Reviewed - No data to display  EKG   Radiology No results found.  Procedures Procedures (including critical care time)  Medications Ordered in UC Medications - No data to display  Initial Impression / Assessment and Plan / UC Course  I have reviewed the triage vital signs and the nursing notes.  Pertinent labs & imaging results that were available during my care of the patient were reviewed by me and considered in my medical decision making (see chart for details).     Laceration of right thumb without foreign body with damage to nail, sequela   The flap on the end of the right thumb is devitalized and we removed this today.  We removed part of the fingernail as well.  The skin under this has already completely healed.  Recommend leaving current dressing in place until later this evening then may remove and wash the area with soap and water.  Recommend applying a small amount of mupirocin  to the area and change twice a day for 2 to 3 days then may leave open to air.  May follow-up at urgent care if needed.  Final Clinical Impressions(s) / UC Diagnoses   Final diagnoses:  Laceration of right thumb without foreign body with damage to nail, sequela     Discharge Instructions      The flap on the end of the right thumb is devitalized and we removed this today.  We removed part of the fingernail as well.  The skin under this has already completely healed.  Recommend leaving current dressing in place until later this evening then may remove and wash the area with soap and water.  Recommend applying a small amount of mupirocin  to the area and change twice a day for 2 to 3 days then may leave open to air.  May follow-up at urgent care  if needed.    ED Prescriptions     Medication Sig Dispense Auth. Provider   mupirocin  ointment (BACTROBAN ) 2 % Apply 1 Application topically 2 (two) times daily. 22 g Teresa Almarie LABOR, [place]      PDMP not reviewed this encounter.    [1]  Social History Tobacco Use   Smoking status: Never    Passive exposure: Yes   Smokeless tobacco: Never  Vaping Use   Vaping status: Never Used  Substance Use Topics   Alcohol use: No   Drug use: Never     Teresa Almarie LABOR, PA-C 12/11/24 1637  "

## 2024-12-11 NOTE — Discharge Instructions (Addendum)
 The flap on the end of the right thumb is devitalized and we removed this today.  We removed part of the fingernail as well.  The skin under this has already completely healed.  Recommend leaving current dressing in place until later this evening then may remove and wash the area with soap and water.  Recommend applying a small amount of mupirocin  to the area and change twice a day for 2 to 3 days then may leave open to air.  May follow-up at urgent care if needed.

## 2025-05-31 ENCOUNTER — Encounter: Admitting: Family Medicine
